# Patient Record
Sex: Female | Born: 1965 | Race: Black or African American | Hispanic: No | Smoking: Never smoker
Health system: Southern US, Community
[De-identification: ages and names within clinical notes are randomized; demographics above are authoritative.]

## PROBLEM LIST (undated history)

## (undated) DIAGNOSIS — B009 Herpesviral infection, unspecified: Secondary | ICD-10-CM

## (undated) DIAGNOSIS — K219 Gastro-esophageal reflux disease without esophagitis: Secondary | ICD-10-CM

## (undated) DIAGNOSIS — R011 Cardiac murmur, unspecified: Secondary | ICD-10-CM

## (undated) DIAGNOSIS — N200 Calculus of kidney: Secondary | ICD-10-CM

## (undated) HISTORY — PX: ABDOMINAL HYSTERECTOMY: SHX81

## (undated) HISTORY — DX: Gastro-esophageal reflux disease without esophagitis: K21.9

## (undated) HISTORY — DX: Calculus of kidney: N20.0

---

## 2006-10-19 ENCOUNTER — Ambulatory Visit: Payer: Self-pay | Admitting: Internal Medicine

## 2006-11-15 ENCOUNTER — Ambulatory Visit: Payer: Self-pay | Admitting: Emergency Medicine

## 2006-11-26 ENCOUNTER — Encounter: Payer: Self-pay | Admitting: Emergency Medicine

## 2006-12-08 ENCOUNTER — Encounter: Payer: Self-pay | Admitting: Emergency Medicine

## 2007-01-08 ENCOUNTER — Encounter: Payer: Self-pay | Admitting: Emergency Medicine

## 2007-01-29 ENCOUNTER — Ambulatory Visit: Payer: Self-pay | Admitting: Family Medicine

## 2007-08-02 ENCOUNTER — Ambulatory Visit: Payer: Self-pay | Admitting: Internal Medicine

## 2007-08-03 ENCOUNTER — Other Ambulatory Visit: Payer: Self-pay

## 2007-08-03 ENCOUNTER — Inpatient Hospital Stay: Payer: Self-pay | Admitting: Internal Medicine

## 2007-08-09 ENCOUNTER — Ambulatory Visit: Payer: Self-pay | Admitting: Internal Medicine

## 2007-12-12 ENCOUNTER — Ambulatory Visit: Payer: Self-pay | Admitting: Unknown Physician Specialty

## 2007-12-22 ENCOUNTER — Ambulatory Visit: Payer: Self-pay | Admitting: Unknown Physician Specialty

## 2008-02-17 ENCOUNTER — Ambulatory Visit: Payer: Self-pay | Admitting: Unknown Physician Specialty

## 2008-03-06 ENCOUNTER — Ambulatory Visit: Payer: Self-pay | Admitting: Unknown Physician Specialty

## 2014-03-27 ENCOUNTER — Ambulatory Visit: Payer: Self-pay | Admitting: Physician Assistant

## 2014-03-27 LAB — URINALYSIS, COMPLETE
Bilirubin,UR: NEGATIVE
Glucose,UR: NEGATIVE
Ketone: NEGATIVE
Leukocyte Esterase: NEGATIVE
Nitrite: NEGATIVE
Ph: 5 (ref 5.0–8.0)
Protein: 100
Specific Gravity: >=1.03 (ref 1.000–1.030)

## 2014-03-29 LAB — URINE CULTURE

## 2017-10-20 ENCOUNTER — Other Ambulatory Visit: Payer: Self-pay

## 2017-10-20 MED ORDER — ACYCLOVIR 400 MG PO TABS: ORAL_TABLET | ORAL | 0 refills | Status: DC

## 2017-11-13 ENCOUNTER — Encounter: Payer: Self-pay | Admitting: Gynecology

## 2017-11-13 ENCOUNTER — Ambulatory Visit
Admission: EM | Admit: 2017-11-13 | Discharge: 2017-11-13 | Disposition: A | Discharge status: Home / Self Care | Payer: Managed Care, Other (non HMO) | Attending: Family Medicine | Admitting: Family Medicine

## 2017-11-13 DIAGNOSIS — R079 Chest pain, unspecified: Secondary | ICD-10-CM | POA: Diagnosis present

## 2017-11-13 DIAGNOSIS — R001 Bradycardia, unspecified: Secondary | ICD-10-CM | POA: Insufficient documentation

## 2017-11-13 MED ORDER — PANTOPRAZOLE SODIUM 40 MG PO TBEC: 40.0000 mg | DELAYED_RELEASE_TABLET | Freq: Every day | ORAL | 0 refills | Status: DC

## 2017-11-13 NOTE — ED Triage Notes (Signed)
Patient c/o chest pain x 1 hour ago.

## 2017-11-13 NOTE — Discharge Instructions (Signed)
Medication as prescribed.  Take care  Dr. Johnatan Baskette  

## 2017-11-13 NOTE — ED Provider Notes (Signed)
[facility]    CSN: [id number] Arrival date & time: 11/13/17  1510   History   Chief Complaint Chief Complaint  Patient presents with  . Chest Pain   HPI 52 year old female presents with chest pain.  Patient has had ongoing chest pain over the past month.  She has had a few episodes.  Had chest pain earlier today around 2 PM.  Lasted approximately 10 minutes and then resolved.  Described as burning.  Located centrally.  No association with exertion.  No diaphoresis.  Patient does state that she has had issues with heartburn in the past.  However, she states that it does not seem to be related to meals.  She has been skipping meals regularly.  Denies family history of cardiac disease.  Non-smoker.  No known exacerbating or relieving factors.  No other associated symptoms.  No other complaints.  PMH, Surgical Hx, Family Hx, Social History reviewed and updated as below.  PMH - Denies medical problems.  Past Surgical History:  Procedure Laterality Date  . ABDOMINAL HYSTERECTOMY      OB History   None      Home Medications    Prior to Admission medications   Medication Sig Start Date End Date Taking? Authorizing Provider  pantoprazole (PROTONIX) 40 MG tablet Take 1 tablet (40 mg total) by mouth daily. 11/13/17   Tommie Sams, DO    Family History Family History  Problem Relation Age of Onset  . Healthy Mother   . Healthy Father     Social History Social History   Tobacco Use  . Smoking status: Never Smoker  . Smokeless tobacco: Never Used  Substance Use Topics  . Alcohol use: Yes  . Drug use: Not on file     Allergies   Patient has no known allergies.   Review of Systems Review of Systems  Respiratory: Negative.   Cardiovascular: Positive for chest pain.   Physical Exam Triage Vital Signs ED Triage Vitals  Enc Vitals Group     BP 11/13/17 1528 106/78     Pulse Rate 11/13/17 1528 60     Resp 11/13/17 1528 16     Temp 11/13/17 1528 98.3 F  (36.8 C)     Temp Source 11/13/17 1528 Oral     SpO2 11/13/17 1528 100 %     Weight 11/13/17 1527 175 lb (79.4 kg)     Height 11/13/17 1527 5\' 6"  (1.676 m)     Head Circumference --      Peak Flow --      Pain Score 11/13/17 1620 1     Pain Loc --      Pain Edu? --      Excl. in GC? --    Updated Vital Signs BP 106/78 (BP Location: Left Arm)   Pulse 60   Temp 98.3 F (36.8 C) (Oral)   Resp 16   Ht 5\' 6"  (1.676 m)   Wt 79.4 kg   SpO2 100%   BMI 28.25 kg/m   Visual Acuity Right Eye Distance:   Left Eye Distance:   Bilateral Distance:    Right Eye Near:   Left Eye Near:    Bilateral Near:     Physical Exam  Constitutional: She is oriented to person, place, and time. She appears well-developed. No distress.  Cardiovascular: Normal rate and regular rhythm.  Pulmonary/Chest: Effort normal and breath sounds normal. She has no wheezes. She has no rales.  Abdominal: Soft. She  exhibits no distension. There is no tenderness.  Neurological: She is alert and oriented to person, place, and time.  Psychiatric: She has a normal mood and affect. Her behavior is normal.  Nursing note and vitals reviewed.  UC Treatments / Results  Labs (all labs ordered are listed, but only abnormal results are displayed) Labs Reviewed - No data to display  EKG Interpretation: Sinus bradycardia at the rate of 55.  Normal axis.  Normal intervals.  No ST-T wave changes.  Normal EKG.  Radiology No results found.  Procedures Procedures (including critical care time)  Medications Ordered in UC Medications - No data to display  Initial Impression / Assessment and Plan / UC Course  I have reviewed the triage vital signs and the nursing notes.  Pertinent labs & imaging results that were available during my care of the patient were reviewed by me and considered in my medical decision making (see chart for details).    52 year old female presents with chest pain.  Suspect GERD.  She is very  well-appearing with no risk factors or family history of cardiac disease.  EKG normal.  Recently had labs which were normal.  Discharging on Protonix.  Final Clinical Impressions(s) / UC Diagnoses   Final diagnoses:  Chest pain, unspecified type     Discharge Instructions     Medication as prescribed.  Take care  Dr. Adriana Simas    ED Prescriptions    Medication Sig Dispense Auth. Provider   pantoprazole (PROTONIX) 40 MG tablet Take 1 tablet (40 mg total) by mouth daily. 30 tablet Tommie Sams, DO     Controlled Substance Prescriptions [place] Controlled Substance Registry consulted? Not Applicable   Tommie Sams, DO 11/13/17 8828

## 2017-11-22 ENCOUNTER — Encounter: Payer: Self-pay | Admitting: Nurse Practitioner

## 2017-11-29 ENCOUNTER — Ambulatory Visit (INDEPENDENT_AMBULATORY_CARE_PROVIDER_SITE_OTHER): Payer: Managed Care, Other (non HMO) | Admitting: Nurse Practitioner

## 2017-11-29 ENCOUNTER — Encounter: Payer: Self-pay | Admitting: Nurse Practitioner

## 2017-11-29 VITALS — BP 125/76 | HR 59 | Resp 16 | Ht 67.0 in | Wt 186.4 lb

## 2017-11-29 DIAGNOSIS — Z1239 Encounter for other screening for malignant neoplasm of breast: Secondary | ICD-10-CM

## 2017-11-29 DIAGNOSIS — K219 Gastro-esophageal reflux disease without esophagitis: Secondary | ICD-10-CM | POA: Diagnosis not present

## 2017-11-29 DIAGNOSIS — Z23 Encounter for immunization: Secondary | ICD-10-CM

## 2017-11-29 DIAGNOSIS — Z6829 Body mass index (BMI) 29.0-29.9, adult: Secondary | ICD-10-CM | POA: Diagnosis not present

## 2017-11-29 DIAGNOSIS — R300 Dysuria: Secondary | ICD-10-CM

## 2017-11-29 DIAGNOSIS — Z0001 Encounter for general adult medical examination with abnormal findings: Secondary | ICD-10-CM | POA: Diagnosis not present

## 2017-11-29 DIAGNOSIS — Z1231 Encounter for screening mammogram for malignant neoplasm of breast: Secondary | ICD-10-CM

## 2017-11-29 NOTE — Progress Notes (Signed)
[facility] [address] [place], [place] [address]  Internal MEDICINE  Office Visit Note  Patient Name: Nina Lowe  [id number]  [id number]  Date of Service: 12/06/2017   Pt is here for routine health maintenance examination  Chief Complaint  Patient presents with  . Annual Exam    pt stated she went to urgent care for chest pains 3wks ago they done EKG didnt find anything she thinks its heartburns, has only happened once since that isit and it happens during the night  . Quality Metric Gaps    mammo,colonoscopy     Patient states that she recently visited urgent care with chest pain. ECG done there was normal. She was diagnosed with acid reflux. She was given a prescription for pantoprazole, but she has not taken it. She is afraid of the side effects associated with taking this medication.  She has also brought with her a waver form for her health insurance, stating that she will participate in medically managed program for weight loss and will be followed closely. This form will help to prevent her insurance premiums from increasing due to BMI over 29.9.     Current Medication: Outpatient Encounter Medications as of 11/29/2017  Medication Sig  . pantoprazole (PROTONIX) 40 MG tablet Take 1 tablet (40 mg total) by mouth daily. (Patient not taking: Reported on 11/29/2017)   No facility-administered encounter medications on file as of 11/29/2017.     Surgical History: Past Surgical History:  Procedure Laterality Date  . ABDOMINAL HYSTERECTOMY      Medical History: History reviewed. No pertinent past medical history.  Family History: Family History  Problem Relation Age of Onset  . Healthy Mother   . Healthy Father       Review of Systems  Constitutional: Negative for activity change, chills, fatigue and unexpected weight change.  HENT: Negative for congestion, postnasal drip, rhinorrhea, sneezing and sore throat.   Eyes: Negative.  Negative  for redness.  Respiratory: Negative for cough, chest tightness and shortness of breath.   Cardiovascular: Negative for chest pain and palpitations.  Gastrointestinal: Negative for abdominal pain, constipation, diarrhea, nausea and vomiting.       Intermittent episodes of GERD.   Genitourinary: Negative for dysuria and frequency.  Musculoskeletal: Negative for arthralgias, back pain, joint swelling and neck pain.  Skin: Negative for rash.  Neurological: Negative.  Negative for tremors and numbness.  Hematological: Negative for adenopathy. Does not bruise/bleed easily.  Psychiatric/Behavioral: Negative for behavioral problems (Depression), sleep disturbance and suicidal ideas. The patient is not nervous/anxious.       Today's Vitals   11/29/17 1446  BP: 125/76  Pulse: (!) 59  Resp: 16  SpO2: 99%  Weight: 186 lb 6.4 oz (84.6 kg)  Height: 5\' 7"  (1.702 m)    Physical Exam  Constitutional: She is oriented to person, place, and time. She appears well-developed and well-nourished. No distress.  HENT:  Head: Normocephalic and atraumatic.  Nose: Nose normal.  Mouth/Throat: Oropharynx is clear and moist. No oropharyngeal exudate.  Eyes: Pupils are equal, round, and reactive to light. Conjunctivae and EOM are normal.  Neck: Normal range of motion. Neck supple. No JVD present. No tracheal deviation present. No thyromegaly present.  Cardiovascular: Normal rate, regular rhythm, normal heart sounds and intact distal pulses. Exam reveals no gallop and no friction rub.  No murmur heard. Pulmonary/Chest: Effort normal and breath sounds normal. No respiratory distress. She has no wheezes. She has no rales. She exhibits  no tenderness. Right breast exhibits no inverted nipple, no mass, no nipple discharge, no skin change and no tenderness. Left breast exhibits no inverted nipple, no mass, no nipple discharge, no skin change and no tenderness.  Abdominal: Soft. Bowel sounds are normal. There is no  tenderness.  Musculoskeletal: Normal range of motion.  Lymphadenopathy:    She has no cervical adenopathy.  Neurological: She is alert and oriented to person, place, and time. No cranial nerve deficit.  Skin: Skin is warm and dry. Capillary refill takes less than 2 seconds. She is not diaphoretic.  Psychiatric: She has a normal mood and affect. Her behavior is normal. Judgment and thought content normal.  Nursing note and vitals reviewed.    LABS: Recent Results (from the past 2160 hour(s))  UA/M w/rflx Culture, Routine     Status: Abnormal   Collection Time: 11/29/17  2:39 PM  Result Value Ref Range   Specific Gravity, UA 1.025 1.005 - 1.030   pH, UA 5.0 5.0 - 7.5   Color, UA Yellow Yellow   Appearance Ur Clear Clear   Leukocytes, UA Negative Negative   Protein, UA Negative Negative/Trace   Glucose, UA Negative Negative   Ketones, UA Trace (A) Negative   RBC, UA Negative Negative   Bilirubin, UA Negative Negative   Urobilinogen, Ur 1.0 0.2 - 1.0 mg/dL   Nitrite, UA Negative Negative   Microscopic Examination Comment     Comment: Microscopic follows if indicated.   Microscopic Examination See below:     Comment: Microscopic was indicated and was performed.   Urinalysis Reflex Comment     Comment: This specimen will not reflex to a Urine Culture.  Microscopic Examination     Status: Abnormal   Collection Time: 11/29/17  2:39 PM  Result Value Ref Range   WBC, UA 0-5 0 - 5 /hpf   RBC, UA None seen 0 - 2 /hpf   Epithelial Cells (non renal) >10 (A) 0 - 10 /hpf   Casts None seen None seen /lpf   Crystals Present (A) N/A   Crystal Type Calcium Oxalate N/A   Mucus, UA Present Not Estab.   Bacteria, UA Few None seen/Few    Assessment/Plan: 1. Encounter for general adult medical examination with abnormal findings Annual wellness visit today.   2. Gastroesophageal reflux disease without esophagitis Recommend she take protonix as prescribed and as needed. Advised her to avoid  triggers and to sleep with HOB raised up 30 degrees.   3. BMI 29.0-29.9,adult A waiver form was completed and returned to the patient at the time of visit, stating that she would be participating in medically managed program for weight management. Goals are to keep calorie intke to 1200-1500 calories per day and to participate in routine exercise program.   4. Needs flu shot - Flu Vaccine MDCK QUAD PF  5. Screening for breast cancer - MM 3D SCREEN BREAST BILATERAL; Future  6. Dysuria - UA/M w/rflx Culture, Routine  General Counseling: Muskaan verbalizes understanding of the findings of todays visit and agrees with plan of treatment. I have discussed any further diagnostic evaluation that may be needed or ordered today. We also reviewed her medications today. she has been encouraged to call the office with any questions or concerns that should arise related to todays visit.    Counseling:   There is a liability release in patients' chart. There has been a 10 minute discussion about the side effects including but not limited to elevated blood  pressure, anxiety, lack of sleep and dry mouth. Pt understands and will like to start/continue on appetite suppressant at this time. There will be one month RX given at the time of visit with proper follow up. Nova diet plan with restricted calories is given to the pt. Pt understands and agrees with  plan of treatment  This patient was seen by Vincent Gros FNP Collaboration with Dr Lyndon Code as a part of collaborative care agreement  Orders Placed This Encounter  Procedures  . Microscopic Examination  . MM 3D SCREEN BREAST BILATERAL  . Flu Vaccine MDCK QUAD PF  . UA/M w/rflx Culture, Routine      Time spent:      Lyndon Code, MD  Internal Medicine

## 2017-11-30 LAB — UA/M W/RFLX CULTURE, ROUTINE
Bilirubin, UA: NEGATIVE
Glucose, UA: NEGATIVE
LEUKOCYTES UA: NEGATIVE
NITRITE UA: NEGATIVE
PH UA: 5 (ref 5.0–7.5)
PROTEIN UA: NEGATIVE
RBC UA: NEGATIVE
Specific Gravity, UA: 1.025 (ref 1.005–1.030)
UUROB: 1 mg/dL (ref 0.2–1.0)

## 2017-11-30 LAB — MICROSCOPIC EXAMINATION
CASTS: NONE SEEN /LPF
RBC, UA: NONE SEEN /hpf (ref 0–2)

## 2017-12-06 DIAGNOSIS — Z1239 Encounter for other screening for malignant neoplasm of breast: Secondary | ICD-10-CM | POA: Insufficient documentation

## 2017-12-06 DIAGNOSIS — Z23 Encounter for immunization: Secondary | ICD-10-CM | POA: Insufficient documentation

## 2017-12-06 DIAGNOSIS — R300 Dysuria: Secondary | ICD-10-CM | POA: Insufficient documentation

## 2017-12-06 DIAGNOSIS — Z6829 Body mass index (BMI) 29.0-29.9, adult: Secondary | ICD-10-CM | POA: Insufficient documentation

## 2017-12-06 DIAGNOSIS — K219 Gastro-esophageal reflux disease without esophagitis: Secondary | ICD-10-CM | POA: Insufficient documentation

## 2017-12-16 ENCOUNTER — Ambulatory Visit
Admission: RE | Admit: 2017-12-16 | Discharge: 2017-12-16 | Disposition: A | Discharge status: Home / Self Care | Payer: Managed Care, Other (non HMO) | Source: Ambulatory Visit | Attending: Nurse Practitioner | Admitting: Nurse Practitioner

## 2017-12-16 DIAGNOSIS — Z1239 Encounter for other screening for malignant neoplasm of breast: Secondary | ICD-10-CM | POA: Insufficient documentation

## 2017-12-24 ENCOUNTER — Encounter: Payer: Self-pay | Admitting: Nurse Practitioner

## 2018-01-11 ENCOUNTER — Telehealth: Payer: Self-pay

## 2018-01-11 NOTE — Telephone Encounter (Signed)
COLOGUARD RESULTS CAME BACK NORMAL. CALLED PATIENT AND INFORMED HER OF THIS.

## 2018-01-24 ENCOUNTER — Other Ambulatory Visit: Payer: Self-pay

## 2018-01-25 ENCOUNTER — Other Ambulatory Visit: Payer: Self-pay

## 2018-01-25 MED ORDER — ACYCLOVIR 400 MG PO TABS: ORAL_TABLET | ORAL | 0 refills | Status: DC

## 2018-02-01 ENCOUNTER — Encounter: Payer: Self-pay | Admitting: Adult Health

## 2018-02-01 LAB — COLOGUARD

## 2018-02-09 ENCOUNTER — Encounter: Payer: Self-pay | Admitting: Emergency Medicine

## 2018-02-09 ENCOUNTER — Ambulatory Visit
Admission: EM | Admit: 2018-02-09 | Discharge: 2018-02-09 | Disposition: A | Discharge status: Home / Self Care | Payer: Managed Care, Other (non HMO) | Attending: Family Medicine | Admitting: Family Medicine

## 2018-02-09 ENCOUNTER — Other Ambulatory Visit: Payer: Self-pay

## 2018-02-09 DIAGNOSIS — B9789 Other viral agents as the cause of diseases classified elsewhere: Secondary | ICD-10-CM | POA: Diagnosis not present

## 2018-02-09 DIAGNOSIS — J069 Acute upper respiratory infection, unspecified: Secondary | ICD-10-CM | POA: Diagnosis not present

## 2018-02-09 DIAGNOSIS — J040 Acute laryngitis: Secondary | ICD-10-CM | POA: Diagnosis not present

## 2018-02-09 HISTORY — DX: Herpesviral infection, unspecified: B00.9

## 2018-02-09 MED ORDER — PREDNISONE 20 MG PO TABS: 20.0000 mg | ORAL_TABLET | Freq: Every day | ORAL | 0 refills | Status: DC

## 2018-02-09 MED ORDER — BENZONATATE 200 MG PO CAPS: 200.0000 mg | ORAL_CAPSULE | Freq: Three times a day (TID) | ORAL | 0 refills | Status: DC | PRN

## 2018-02-09 MED ORDER — HYDROCOD POLST-CPM POLST ER 10-8 MG/5ML PO SUER: 5.0000 mL | Freq: Every evening | ORAL | 0 refills | Status: DC | PRN

## 2018-02-09 NOTE — ED Provider Notes (Signed)
[facility]    CSN: [id number] Arrival date & time: 02/09/18  0857     History   Chief Complaint Chief Complaint  Patient presents with  . Cough    APPT  . Laryngitis    HPI Nina Lowe is a 52 y.o. female.   The history is provided by the patient.  URI  Presenting symptoms: congestion, cough and rhinorrhea   Severity:  Moderate Onset quality:  Sudden Duration:  5 days Timing:  Constant Progression:  Worsening Chronicity:  New Relieved by:  Nothing Associated symptoms comment:  Loss of voice Risk factors: sick contacts   Risk factors: not elderly, no chronic cardiac disease, no chronic kidney disease, no chronic respiratory disease, no diabetes mellitus, no immunosuppression, no recent illness and no recent travel     Past Medical History:  Diagnosis Date  . Herpes     Patient Active Problem List   Diagnosis Date Noted  . Screening for breast cancer 12/06/2017  . Gastroesophageal reflux disease without esophagitis 12/06/2017  . BMI 29.0-29.9,adult 12/06/2017  . Needs flu shot 12/06/2017  . Dysuria 12/06/2017    Past Surgical History:  Procedure Laterality Date  . ABDOMINAL HYSTERECTOMY      OB History   None      Home Medications    Prior to Admission medications   Medication Sig Start Date End Date Taking? Authorizing Provider  acyclovir (ZOVIRAX) 400 MG tablet Take 1 tab by po 3 times a day for 5 days for flare ups 01/25/18  Yes Boscia, Heather E, NP  benzonatate (TESSALON) 200 MG capsule Take 1 capsule (200 mg total) by mouth 3 (three) times daily as needed. 02/09/18   Payton Mccallumonty, Caylyn Tedeschi, MD  chlorpheniramine-HYDROcodone (TUSSIONEX PENNKINETIC ER) 10-8 MG/5ML SUER Take 5 mLs by mouth at bedtime as needed. 02/09/18   Payton Mccallumonty, Shyhiem Beeney, MD  pantoprazole (PROTONIX) 40 MG tablet Take 1 tablet (40 mg total) by mouth daily. Patient not taking: Reported on 11/29/2017 11/13/17   Tommie Samsook, Jayce G, DO  predniSONE (DELTASONE) 20 MG tablet Take 1  tablet (20 mg total) by mouth daily. 02/09/18   Payton Mccallumonty, Danile Trier, MD    Family History Family History  Problem Relation Age of Onset  . Hyperlipidemia Mother   . Healthy Father   . Breast cancer Cousin [id number]    Social History Social History   Tobacco Use  . Smoking status: Never Smoker  . Smokeless tobacco: Never Used  Substance Use Topics  . Alcohol use: Yes    Comment: occassional  . Drug use: Never     Allergies   Patient has no known allergies.   Review of Systems Review of Systems  HENT: Positive for congestion and rhinorrhea.   Respiratory: Positive for cough.      Physical Exam Triage Vital Signs ED Triage Vitals [02/09/18 0918]  Enc Vitals Group     BP 129/80     Pulse Rate 77     Resp 16     Temp 98.1 F (36.7 C)     Temp Source Oral     SpO2 100 %     Weight 170 lb (77.1 kg)     Height 5\' 7"  (1.702 m)     Head Circumference      Peak Flow      Pain Score 0     Pain Loc      Pain Edu?      Excl. in GC?    No data found.  Updated Vital Signs BP 129/80 (BP Location: Left Arm)   Pulse 77   Temp 98.1 F (36.7 C) (Oral)   Resp 16   Ht 5\' 7"  (1.702 m)   Wt 77.1 kg   SpO2 100%   BMI 26.63 kg/m   Visual Acuity Right Eye Distance:   Left Eye Distance:   Bilateral Distance:    Right Eye Near:   Left Eye Near:    Bilateral Near:     Physical Exam  Constitutional: She appears well-developed and well-nourished. No distress.  HENT:  Head: Normocephalic and atraumatic.  Right Ear: Tympanic membrane, external ear and ear canal normal.  Left Ear: Tympanic membrane, external ear and ear canal normal.  Nose: Mucosal edema and rhinorrhea present. No nose lacerations, sinus tenderness, nasal deformity, septal deviation or nasal septal hematoma. No epistaxis.  No foreign bodies.  Mouth/Throat: Uvula is midline and mucous membranes are normal. Posterior oropharyngeal erythema present. No oropharyngeal exudate, posterior oropharyngeal edema or tonsillar  abscesses. No tonsillar exudate.  Eyes: Right eye exhibits no discharge. Left eye exhibits no discharge. No scleral icterus.  Neck: Normal range of motion. Neck supple. No thyromegaly present.  Cardiovascular: Normal rate, regular rhythm and normal heart sounds.  Pulmonary/Chest: Effort normal and breath sounds normal. No stridor. No respiratory distress. She has no wheezes. She has no rales.  Lymphadenopathy:    She has no cervical adenopathy.  Skin: She is not diaphoretic.  Nursing note and vitals reviewed.    UC Treatments / Results  Labs (all labs ordered are listed, but only abnormal results are displayed) Labs Reviewed - No data to display  EKG None  Radiology No results found.  Procedures Procedures (including critical care time)  Medications Ordered in UC Medications - No data to display  Initial Impression / Assessment and Plan / UC Course  I have reviewed the triage vital signs and the nursing notes.  Pertinent labs & imaging results that were available during my care of the patient were reviewed by me and considered in my medical decision making (see chart for details).      Final Clinical Impressions(s) / UC Diagnoses   Final diagnoses:  Viral URI with cough  Laryngitis    ED Prescriptions    Medication Sig Dispense Auth. Provider   predniSONE (DELTASONE) 20 MG tablet Take 1 tablet (20 mg total) by mouth daily. 7 tablet Brittny Spangle, Pamala Hurry, MD   benzonatate (TESSALON) 200 MG capsule Take 1 capsule (200 mg total) by mouth 3 (three) times daily as needed. 30 capsule Payton Mccallum, MD   chlorpheniramine-HYDROcodone (TUSSIONEX PENNKINETIC ER) 10-8 MG/5ML SUER Take 5 mLs by mouth at bedtime as needed. 60 mL Payton Mccallum, MD      1. diagnosis reviewed with patient 2. rx as per orders above; reviewed possible side effects, interactions, risks and benefits  3. Recommend supportive treatment with otc meds prn 4. Follow-up prn if symptoms worsen or don't  improve   Controlled Substance Prescriptions [place] Controlled Substance Registry consulted? Not Applicable   Payton Mccallum, MD 02/09/18 1143

## 2018-02-09 NOTE — ED Triage Notes (Signed)
Patient in today c/o cough and laryngitis x 5 days. Patient denies fever. Patient has tried OTC Delsym, Tussin, Thermaflu without relief.

## 2018-12-01 ENCOUNTER — Encounter: Payer: Self-pay | Admitting: Adult Health

## 2018-12-07 ENCOUNTER — Ambulatory Visit (INDEPENDENT_AMBULATORY_CARE_PROVIDER_SITE_OTHER): Payer: Managed Care, Other (non HMO) | Admitting: Nurse Practitioner

## 2018-12-07 ENCOUNTER — Other Ambulatory Visit: Payer: Self-pay

## 2018-12-07 ENCOUNTER — Encounter: Payer: Self-pay | Admitting: Nurse Practitioner

## 2018-12-07 VITALS — BP 120/82 | HR 50 | Temp 97.4°F | Resp 16 | Ht 67.0 in | Wt 199.0 lb

## 2018-12-07 DIAGNOSIS — Z0001 Encounter for general adult medical examination with abnormal findings: Secondary | ICD-10-CM

## 2018-12-07 DIAGNOSIS — R300 Dysuria: Secondary | ICD-10-CM

## 2018-12-07 DIAGNOSIS — N390 Urinary tract infection, site not specified: Secondary | ICD-10-CM

## 2018-12-07 DIAGNOSIS — R5383 Other fatigue: Secondary | ICD-10-CM | POA: Diagnosis not present

## 2018-12-07 DIAGNOSIS — Z1239 Encounter for other screening for malignant neoplasm of breast: Secondary | ICD-10-CM | POA: Diagnosis not present

## 2018-12-07 DIAGNOSIS — Z124 Encounter for screening for malignant neoplasm of cervix: Secondary | ICD-10-CM | POA: Insufficient documentation

## 2018-12-07 LAB — POCT URINALYSIS DIPSTICK
Bilirubin, UA: POSITIVE
Blood, UA: NEGATIVE
Glucose, UA: NEGATIVE
Ketones, UA: NEGATIVE
Nitrite, UA: NEGATIVE
Protein, UA: POSITIVE — AB
Spec Grav, UA: 1.025 (ref 1.010–1.025)
Urobilinogen, UA: 0.2 E.U./dL
pH, UA: 5 (ref 5.0–8.0)

## 2018-12-07 MED ORDER — SULFAMETHOXAZOLE-TRIMETHOPRIM 800-160 MG PO TABS: 1.0000 | ORAL_TABLET | Freq: Two times a day (BID) | ORAL | 0 refills | Status: DC

## 2018-12-07 NOTE — Progress Notes (Signed)
[facility] [address] [place], [place] [address]  Internal MEDICINE  Office Visit Note  Patient Name: Nina BoronRobbin Rochelle Lowe  [id number]  [id number]  Date of Service: 12/07/2018  Chief Complaint  Patient presents with  . Annual Exam  . Back Pain    lower back pain that has went down to her tail bone, has been using preparation h for hemmrhoids  . Arthritis    feels like theres arthritis all over body sometimes    [name] presents to clinic today for an annual physical exam. She also has complaints of lower back pain and coccydynia for approximately 6 days. She reports that the pain began last Thursday upon standing from her office chair. She saw a chiropractor the following day, which she reports seems to have aggravated her symptoms. She reports that the pain is aggravated by prolonged sitting and radiates up her back upon standing. She describes it as aching, shooting, and stiffness. She reports that her pain has gone from being severe to feeling "better this morning." She states that she suspects that her coccydynia may be related to hemorrhoids, and that soaking in a hot bath and application of preparation H has helped. She also reports that she experiences intermittent constipation and must occasionally strain with bowel movements. She also reports some numbness and tingling with prolonged sitting or lying in one position, and some generalized arthralgia.      Current Medication: Outpatient Encounter Medications as of 12/07/2018  Medication Sig  . acyclovir (ZOVIRAX) 400 MG tablet Take 1 tab by po 3 times a day for 5 days for flare ups  . sulfamethoxazole-trimethoprim (BACTRIM DS) 800-160 MG tablet Take 1 tablet by mouth 2 (two) times daily.  . [DISCONTINUED] benzonatate (TESSALON) 200 MG capsule Take 1 capsule (200 mg total) by mouth 3 (three) times daily as needed. (Patient not taking: Reported on 12/07/2018)  . [DISCONTINUED] chlorpheniramine-HYDROcodone  (TUSSIONEX PENNKINETIC ER) 10-8 MG/5ML SUER Take 5 mLs by mouth at bedtime as needed. (Patient not taking: Reported on 12/07/2018)  . [DISCONTINUED] pantoprazole (PROTONIX) 40 MG tablet Take 1 tablet (40 mg total) by mouth daily. (Patient not taking: Reported on 11/29/2017)  . [DISCONTINUED] predniSONE (DELTASONE) 20 MG tablet Take 1 tablet (20 mg total) by mouth daily. (Patient not taking: Reported on 12/07/2018)   No facility-administered encounter medications on file as of 12/07/2018.     Surgical History: Past Surgical History:  Procedure Laterality Date  . ABDOMINAL HYSTERECTOMY      Medical History: Past Medical History:  Diagnosis Date  . Herpes     Family History: Family History  Problem Relation Age of Onset  . Hyperlipidemia Mother   . Healthy Father   . Breast cancer Cousin 1251    Social History   Socioeconomic History  . Marital status: Married    Spouse name: Not on file  . Number of children: Not on file  . Years of education: Not on file  . Highest education level: Not on file  Occupational History  . Not on file  Social Needs  . Financial resource strain: Not on file  . Food insecurity    Worry: Not on file    Inability: Not on file  . Transportation needs    Medical: Not on file    Non-medical: Not on file  Tobacco Use  . Smoking status: Never Smoker  . Smokeless tobacco: Never Used  Substance and Sexual Activity  . Alcohol use: Yes    Comment:  occassional  . Drug use: Never  . Sexual activity: Not on file  Lifestyle  . Physical activity    Days per week: Not on file    Minutes per session: Not on file  . Stress: Not on file  Relationships  . Social Musician on phone: Not on file    Gets together: Not on file    Attends religious service: Not on file    Active member of club or organization: Not on file    Attends meetings of clubs or organizations: Not on file    Relationship status: Not on file  . Intimate partner violence     Fear of current or ex partner: Not on file    Emotionally abused: Not on file    Physically abused: Not on file    Forced sexual activity: Not on file  Other Topics Concern  . Not on file  Social History Narrative  . Not on file      Review of Systems  Constitutional: Negative for chills, fatigue, fever and unexpected weight change.  HENT: Negative for congestion, hearing loss, rhinorrhea, sinus pressure and sinus pain.   Respiratory: Negative for chest tightness, shortness of breath and wheezing.   Cardiovascular: Positive for palpitations. Negative for chest pain.       Reports occasional palpitations when anxious, subsides with calm  Gastrointestinal: Positive for rectal pain. Negative for diarrhea, nausea and vomiting.       Possible hemorrhoids   Endocrine: Negative for cold intolerance, heat intolerance, polydipsia and polyuria.       Reports "hot flashes" that she attributes perimenopause  Genitourinary: Positive for flank pain. Negative for difficulty urinating and dysuria.  Musculoskeletal: Positive for back pain.  Allergic/Immunologic: Negative for food allergies.  Neurological: Negative for dizziness, light-headedness and headaches.       Numbness and tingling with prolonged sitting or lying   Today's Vitals   12/07/18 0858  BP: 120/82  Pulse: (!) 50  Resp: 16  Temp: (!) 97.4 F (36.3 C)  SpO2: 99%  Weight: 199 lb (90.3 kg)   Body mass index is 31.17 kg/m.   Physical Exam Vitals signs and nursing note reviewed.  Constitutional:      General: She is not in acute distress.    Appearance: Normal appearance.  HENT:     Nose: Nose normal.  Eyes:     Extraocular Movements: Extraocular movements intact.     Pupils: Pupils are equal, round, and reactive to light.  Neck:     Musculoskeletal: Normal range of motion and neck supple. No muscular tenderness.  Cardiovascular:     Rate and Rhythm: Normal rate and regular rhythm.     Pulses: Normal pulses.      Heart sounds: Normal heart sounds.  Pulmonary:     Effort: Pulmonary effort is normal.     Breath sounds: Normal breath sounds.  Chest:     Breasts:        Right: Normal. No swelling, bleeding, inverted nipple, mass, nipple discharge, skin change or tenderness.        Left: Normal. No swelling, bleeding, inverted nipple, mass, nipple discharge, skin change or tenderness.  Abdominal:     General: Bowel sounds are normal. There is no distension.     Palpations: Abdomen is soft. There is no mass.     Tenderness: There is no abdominal tenderness. There is no right CVA tenderness or left CVA tenderness.  Genitourinary:  Comments: Small, internal hemorrhoid present  Musculoskeletal: Normal range of motion.        General: No swelling or tenderness.  Skin:    General: Skin is warm and dry.     Capillary Refill: Capillary refill takes 2 to 3 seconds.  Neurological:     Mental Status: She is alert and oriented to person, place, and time.  Psychiatric:        Mood and Affect: Mood normal.    Assessment/Plan:  1. Encounter for general adult medical examination with abnormal findings Annual health maintenance exam today. Lab slip given to get routine, fasting labs drawn.   2. Urinary tract infection without hematuria, site unspecified Start bactrim DS twice daily for 5 days. Send for culture and sensitivity and adjust antibiotics as indicated  - sulfamethoxazole-trimethoprim (BACTRIM DS) 800-160 MG tablet; Take 1 tablet by mouth 2 (two) times daily.  Dispense: 10 tablet; Refill: 0  3. Other fatigue Check labs, including thyroid panel for further evaluation.   4. Screening for breast cancer - MM DIGITAL SCREENING BILATERAL; Future  5. Dysuria - POCT Urinalysis Dipstick - CULTURE, URINE COMPREHENSIVE  General Counseling: Gladyse verbalizes understanding of the findings of todays visit and agrees with plan of treatment. I have discussed any further diagnostic evaluation that may be  needed or ordered today. We also reviewed her medications today. she has been encouraged to call the office with any questions or concerns that should arise related to todays visit.  This patient was seen by Leretha Pol FNP Collaboration with Dr Lavera Guise as a part of collaborative care agreement  Orders Placed This Encounter  Procedures  . CULTURE, URINE COMPREHENSIVE  . MM DIGITAL SCREENING BILATERAL  . POCT Urinalysis Dipstick    Meds ordered this encounter  Medications  . sulfamethoxazole-trimethoprim (BACTRIM DS) 800-160 MG tablet    Sig: Take 1 tablet by mouth 2 (two) times daily.    Dispense:  10 tablet    Refill:  0    Order Specific Question:   Supervising Provider    Answer:   Lavera Guise [[id number]]    Time spent: 78 Minutes      Dr Lavera Guise Internal medicine

## 2018-12-09 NOTE — Progress Notes (Signed)
Patient started on bactrim ds at her visit. Waiting for culture results.

## 2018-12-10 LAB — CULTURE, URINE COMPREHENSIVE

## 2018-12-30 ENCOUNTER — Other Ambulatory Visit: Payer: Self-pay | Admitting: Nurse Practitioner

## 2018-12-31 LAB — COMPREHENSIVE METABOLIC PANEL
ALT: 11 IU/L (ref 0–32)
AST: 15 IU/L (ref 0–40)
Albumin/Globulin Ratio: 1.3 (ref 1.2–2.2)
Albumin: 4.4 g/dL (ref 3.8–4.9)
Alkaline Phosphatase: 136 IU/L — ABNORMAL HIGH (ref 39–117)
BUN/Creatinine Ratio: 19 (ref 9–23)
BUN: 14 mg/dL (ref 6–24)
Bilirubin Total: 0.3 mg/dL (ref 0.0–1.2)
CO2: 22 mmol/L (ref 20–29)
Calcium: 9.5 mg/dL (ref 8.7–10.2)
Chloride: 103 mmol/L (ref 96–106)
Creatinine, Ser: 0.75 mg/dL (ref 0.57–1.00)
GFR calc Af Amer: 107 mL/min/{1.73_m2} (ref >59)
GFR calc non Af Amer: 93 mL/min/{1.73_m2} (ref >59)
Globulin, Total: 3.3 g/dL (ref 1.5–4.5)
Glucose: 81 mg/dL (ref 65–99)
Potassium: 4.6 mmol/L (ref 3.5–5.2)
Sodium: 140 mmol/L (ref 134–144)
Total Protein: 7.7 g/dL (ref 6.0–8.5)

## 2018-12-31 LAB — LIPID PANEL W/O CHOL/HDL RATIO
Cholesterol, Total: 160 mg/dL (ref 100–199)
HDL: 55 mg/dL (ref >39)
LDL Chol Calc (NIH): 97 mg/dL (ref 0–99)
Triglycerides: 37 mg/dL (ref 0–149)
VLDL Cholesterol Cal: 8 mg/dL (ref 5–40)

## 2018-12-31 LAB — CBC
Hematocrit: 38.2 % (ref 34.0–46.6)
Hemoglobin: 12.3 g/dL (ref 11.1–15.9)
MCH: 28.4 pg (ref 26.6–33.0)
MCHC: 32.2 g/dL (ref 31.5–35.7)
MCV: 88 fL (ref 79–97)
Platelets: 276 10*3/uL (ref 150–450)
RBC: 4.33 x10E6/uL (ref 3.77–5.28)
RDW: 12.2 % (ref 11.7–15.4)
WBC: 4.5 10*3/uL (ref 3.4–10.8)

## 2018-12-31 LAB — VITAMIN D 25 HYDROXY (VIT D DEFICIENCY, FRACTURES): Vit D, 25-Hydroxy: 23.1 ng/mL — ABNORMAL LOW (ref 30.0–100.0)

## 2018-12-31 LAB — T4, FREE: Free T4: 1.31 ng/dL (ref 0.82–1.77)

## 2018-12-31 LAB — TSH: TSH: 1.06 u[IU]/mL (ref 0.450–4.500)

## 2019-01-08 NOTE — Progress Notes (Signed)
Mild vitamin d deficiency. Recommend OTC vitamin d daily. All other labs normal.

## 2019-03-14 ENCOUNTER — Ambulatory Visit
Admission: RE | Admit: 2019-03-14 | Discharge: 2019-03-14 | Disposition: A | Discharge status: Home / Self Care | Payer: Managed Care, Other (non HMO) | Source: Ambulatory Visit | Attending: Nurse Practitioner | Admitting: Nurse Practitioner

## 2019-03-14 DIAGNOSIS — Z1239 Encounter for other screening for malignant neoplasm of breast: Secondary | ICD-10-CM | POA: Diagnosis present

## 2019-03-14 DIAGNOSIS — Z1231 Encounter for screening mammogram for malignant neoplasm of breast: Secondary | ICD-10-CM | POA: Diagnosis not present

## 2019-03-14 NOTE — Progress Notes (Signed)
Negative mammogram

## 2019-08-28 ENCOUNTER — Encounter: Payer: Self-pay | Admitting: Nurse Practitioner

## 2019-08-28 ENCOUNTER — Ambulatory Visit: Payer: Managed Care, Other (non HMO) | Admitting: Nurse Practitioner

## 2019-08-28 VITALS — Resp 16 | Ht 67.0 in | Wt 185.0 lb

## 2019-08-28 DIAGNOSIS — J069 Acute upper respiratory infection, unspecified: Secondary | ICD-10-CM | POA: Diagnosis not present

## 2019-08-28 DIAGNOSIS — R05 Cough: Secondary | ICD-10-CM

## 2019-08-28 DIAGNOSIS — R059 Cough, unspecified: Secondary | ICD-10-CM | POA: Insufficient documentation

## 2019-08-28 MED ORDER — AZITHROMYCIN 250 MG PO TABS: ORAL_TABLET | ORAL | 0 refills | Status: DC

## 2019-08-28 MED ORDER — HYDROCOD POLST-CPM POLST ER 10-8 MG/5ML PO SUER: 5.0000 mL | Freq: Two times a day (BID) | ORAL | 0 refills | Status: DC | PRN

## 2019-08-28 NOTE — Progress Notes (Signed)
[facility] [address] [place], [place] [address]  Internal MEDICINE  Telephone Visit  Patient Name: Nina Lowe  [id number]  [id number]  Date of Service: 08/28/2019  I connected with the patient at 5:10pm by telephone and verified the patients identity using two identifiers.   I discussed the limitations, risks, security and privacy concerns of performing an evaluation and management service by telephone and the availability of in person appointments. I also discussed with the patient that there may be a patient responsible charge related to the service.  The patient expressed understanding and agrees to proceed.    Chief Complaint  Patient presents with  . Telephone Screen  . Telephone Assessment  . Cough    Persistent cough; medication given (name unclear) caused severe headaches    The patient has been contacted via telephone for follow up visit due to concerns for spread of novel coronavirus. She presents for acute visit. She states that she has had this cough for about two weeks. Was given prescription for tessalon perls. They give her a terrible headache. She states that she has had some nasal congestion. Taking sudofed for this which does help congestion, however congestion does come right back. Cough keeps her awake at night. She denies fever. Denies recent exposure to COVID 19.       Current Medication: Outpatient Encounter Medications as of 08/28/2019  Medication Sig  . acyclovir (ZOVIRAX) 400 MG tablet Take 1 tab by po 3 times a day for 5 days for flare ups  . azithromycin (ZITHROMAX) 250 MG tablet z-pack - take as directed for 5 days  . chlorpheniramine-HYDROcodone (TUSSIONEX PENNKINETIC ER) 10-8 MG/5ML SUER Take 5 mLs by mouth every 12 (twelve) hours as needed for cough.  . [DISCONTINUED] sulfamethoxazole-trimethoprim (BACTRIM DS) 800-160 MG tablet Take 1 tablet by mouth 2 (two) times daily. (Patient not taking: Reported on 08/28/2019)   No  facility-administered encounter medications on file as of 08/28/2019.    Surgical History: Past Surgical History:  Procedure Laterality Date  . ABDOMINAL HYSTERECTOMY      Medical History: Past Medical History:  Diagnosis Date  . Herpes     Family History: Family History  Problem Relation Age of Onset  . Hyperlipidemia Mother   . Healthy Father   . Breast cancer Cousin 98    Social History   Socioeconomic History  . Marital status: Married    Spouse name: Not on file  . Number of children: Not on file  . Years of education: Not on file  . Highest education level: Not on file  Occupational History  . Not on file  Tobacco Use  . Smoking status: Never Smoker  . Smokeless tobacco: Never Used  Vaping Use  . Vaping Use: Never used  Substance and Sexual Activity  . Alcohol use: Yes    Comment: occassional  . Drug use: Never  . Sexual activity: Not on file  Other Topics Concern  . Not on file  Social History Narrative  . Not on file   Social Determinants of Health   Financial Resource Strain:   . Difficulty of Paying Living Expenses:   Food Insecurity:   . Worried About Programme researcher, broadcasting/film/video in the Last Year:   . Barista in the Last Year:   Transportation Needs:   . Freight forwarder (Medical):   Marland Kitchen Lack of Transportation (Non-Medical):   Physical Activity:   . Days of Exercise per Week:   .  Minutes of Exercise per Session:   Stress:   . Feeling of Stress :   Social Connections:   . Frequency of Communication with Friends and Family:   . Frequency of Social Gatherings with Friends and Family:   . Attends Religious Services:   . Active Member of Clubs or Organizations:   . Attends Archivist Meetings:   Marland Kitchen Marital Status:   Intimate Partner Violence:   . Fear of Current or Ex-Partner:   . Emotionally Abused:   Marland Kitchen Physically Abused:   . Sexually Abused:       Review of Systems  Constitutional: Positive for fatigue. Negative for  chills, fever and unexpected weight change.  HENT: Positive for congestion, postnasal drip, sinus pressure and sinus pain. Negative for rhinorrhea, sneezing and sore throat.   Respiratory: Positive for cough. Negative for chest tightness and shortness of breath.   Cardiovascular: Negative for chest pain and palpitations.  Gastrointestinal: Negative for abdominal pain, constipation, diarrhea, nausea and vomiting.  Musculoskeletal: Negative for arthralgias, back pain, joint swelling and neck pain.  Skin: Negative for rash.  Allergic/Immunologic: Positive for environmental allergies.  Neurological: Positive for headaches. Negative for tremors and numbness.  Hematological: Negative for adenopathy. Does not bruise/bleed easily.  Psychiatric/Behavioral: Negative for behavioral problems (Depression), sleep disturbance and suicidal ideas. The patient is not nervous/anxious.     Today's Vitals   08/28/19 1541  Resp: 16  Weight: 185 lb (83.9 kg)  Height: 5\' 7"  (1.702 m)   Body mass index is 28.98 kg/m.  Observation/Objective:   The patient is alert and oriented. She is pleasant and answers all questions appropriately. Breathing is non-labored. She is in no acute distress at this time. The patient sounds nasally congested. She has dry, non-productive cough which is heard throughout the phone call.    Assessment/Plan: 1. Acute upper respiratory infection Start z-pack. Take as directed for 5 days. Rest and increase fluids. Use OTC medication as needed and as indicated to reduce acute syomtons  - azithromycin (ZITHROMAX) 250 MG tablet; z-pack - take as directed for 5 days  Dispense: 6 tablet; Refill: 0  2. Cough tussionex cough suppressant may be taken at night as needed for cough. Advised patient not to overuse this medicine and not to mix with other medications or alcohol as it can cause respiratory distress, sleepiness or dizziness. Should also avoid driving. Patient voiced understanding and  agreement. Encouraged her to take OTC cough suppressant such as Delsym as needed during the day.  - chlorpheniramine-HYDROcodone (TUSSIONEX PENNKINETIC ER) 10-8 MG/5ML SUER; Take 5 mLs by mouth every 12 (twelve) hours as needed for cough.  Dispense: 115 mL; Refill: 0  General Counseling: Nina Lowe verbalizes understanding of the findings of today's phone visit and agrees with plan of treatment. I have discussed any further diagnostic evaluation that may be needed or ordered today. We also reviewed her medications today. she has been encouraged to call the office with any questions or concerns that should arise related to todays visit.  This patient was seen by [place] with Dr Lavera Guise as a part of collaborative care agreement  Meds ordered this encounter  Medications  . chlorpheniramine-HYDROcodone (TUSSIONEX PENNKINETIC ER) 10-8 MG/5ML SUER    Sig: Take 5 mLs by mouth every 12 (twelve) hours as needed for cough.    Dispense:  115 mL    Refill:  0    Order Specific Question:   Supervising Provider    Answer:  KHAN, FOZIA M [[id number]]  . azithromycin (ZITHROMAX) 250 MG tablet    Sig: z-pack - take as directed for 5 days    Dispense:  6 tablet    Refill:  0    Order Specific Question:   Supervising Provider    Answer:   Lyndon Code [[id number]]    Time spent: 10 Minutes    Dr Lyndon Code Internal medicine

## 2019-10-12 ENCOUNTER — Telehealth: Payer: Self-pay

## 2019-10-12 NOTE — Telephone Encounter (Signed)
Confirmed and screened for office visit on 8/9 

## 2019-10-16 ENCOUNTER — Ambulatory Visit: Payer: Managed Care, Other (non HMO) | Admitting: Nurse Practitioner

## 2019-10-23 ENCOUNTER — Telehealth: Payer: Self-pay

## 2019-10-23 NOTE — Telephone Encounter (Signed)
I feel like she needs to be seen for this. Unsure if this is heart burn or something else.

## 2019-10-23 NOTE — Telephone Encounter (Signed)
Left pt vm to call back and schedule an appt.  dbs

## 2019-12-08 ENCOUNTER — Ambulatory Visit (INDEPENDENT_AMBULATORY_CARE_PROVIDER_SITE_OTHER): Payer: Managed Care, Other (non HMO) | Admitting: Nurse Practitioner

## 2019-12-08 ENCOUNTER — Other Ambulatory Visit: Payer: Self-pay

## 2019-12-08 ENCOUNTER — Encounter: Payer: Self-pay | Admitting: Nurse Practitioner

## 2019-12-08 VITALS — BP 128/77 | HR 58 | Temp 97.5°F | Resp 16 | Ht 67.0 in | Wt 186.0 lb

## 2019-12-08 DIAGNOSIS — R5383 Other fatigue: Secondary | ICD-10-CM

## 2019-12-08 DIAGNOSIS — Z124 Encounter for screening for malignant neoplasm of cervix: Secondary | ICD-10-CM

## 2019-12-08 DIAGNOSIS — Z23 Encounter for immunization: Secondary | ICD-10-CM

## 2019-12-08 DIAGNOSIS — Z0001 Encounter for general adult medical examination with abnormal findings: Secondary | ICD-10-CM

## 2019-12-08 DIAGNOSIS — K219 Gastro-esophageal reflux disease without esophagitis: Secondary | ICD-10-CM

## 2019-12-08 DIAGNOSIS — Z1231 Encounter for screening mammogram for malignant neoplasm of breast: Secondary | ICD-10-CM

## 2019-12-08 DIAGNOSIS — R300 Dysuria: Secondary | ICD-10-CM

## 2019-12-08 MED ORDER — OMEPRAZOLE 40 MG PO CPDR: 40.0000 mg | DELAYED_RELEASE_CAPSULE | Freq: Every day | ORAL | 3 refills | Status: DC

## 2019-12-08 NOTE — Progress Notes (Signed)
[facility] [place], [place] [address]  Internal MEDICINE  Office Visit Note  Patient Name: Nina Lowe  [id number]  [id number]  Date of Service: 12/27/2019  Chief Complaint  Patient presents with  . Annual Exam    cant take vitamins seems to give her heart burn  . Gastroesophageal Reflux    real bad,drinks hot water in the morining it seems to help  . Cough    when drinking ginger ale she coughs and throws up its been a couple months  . Quality Metric Gaps    tetnaus, Hep C     The patient is here for health maintenance exam and pap smear today. She states that she has been having a lot of problems with acid reflux. She states that this gets worse if she drinks gingerale or eats certain foods. It is also bad if she takes any sort of vitamin. She states that it can sometimes feel like heartburn, but other times it causes her to cough and frequently clear her throat. Taking vitamins or other medications causes her to feel very nauseated and sometimes vomit. She states this does not happen every time she eats. She has taken pepcid AC for this and also Tums. The Tums did help for a short period but was afraid to take too much. She states that Pepcid made her very nauseated.   Pt is here for routine health maintenance examination  Current Medication: Outpatient Encounter Medications as of 12/08/2019  Medication Sig  . acyclovir (ZOVIRAX) 400 MG tablet Take 1 tab by po 3 times a day for 5 days for flare ups  . [DISCONTINUED] azithromycin (ZITHROMAX) 250 MG tablet z-pack - take as directed for 5 days  . [DISCONTINUED] chlorpheniramine-HYDROcodone (TUSSIONEX PENNKINETIC ER) 10-8 MG/5ML SUER Take 5 mLs by mouth every 12 (twelve) hours as needed for cough.  Marland Kitchen omeprazole (PRILOSEC) 40 MG capsule Take 1 capsule (40 mg total) by mouth daily.   No facility-administered encounter medications on file as of 12/08/2019.    Surgical History: Past Surgical  History:  Procedure Laterality Date  . ABDOMINAL HYSTERECTOMY      Medical History: Past Medical History:  Diagnosis Date  . Herpes     Family History: Family History  Problem Relation Age of Onset  . Hyperlipidemia Mother   . Healthy Father   . Breast cancer Cousin 51      Review of Systems  Constitutional: Negative for activity change, chills, fatigue and unexpected weight change.  HENT: Negative for congestion, postnasal drip, rhinorrhea, sneezing and sore throat.   Respiratory: Negative for cough, chest tightness, shortness of breath and wheezing.   Cardiovascular: Negative for chest pain and palpitations.  Gastrointestinal: Positive for nausea. Negative for abdominal pain, constipation, diarrhea and vomiting.       Eating causing her to have excess acid production with cough. This sometimes causes her to vomit.   Endocrine: Negative for cold intolerance, heat intolerance, polydipsia and polyuria.  Genitourinary: Negative for dysuria, frequency and urgency.  Musculoskeletal: Negative for arthralgias, back pain, joint swelling and neck pain.  Skin: Negative for rash.  Allergic/Immunologic: Negative for environmental allergies.  Neurological: Negative for dizziness, tremors, numbness and headaches.  Hematological: Negative for adenopathy. Does not bruise/bleed easily.  Psychiatric/Behavioral: Negative for behavioral problems (Depression), sleep disturbance and suicidal ideas. The patient is not nervous/anxious.      Today's Vitals   12/08/19 0902  BP: 128/77  Pulse: (!) 58  Resp: 16  Temp: (!) 97.5 F (36.4 C)  SpO2: 98%  Weight: 186 lb (84.4 kg)  Height: $Remove'5\' 7"'sbuojlm$  (1.702 m)   Body mass index is 29.13 kg/m.  Physical Exam Vitals and nursing note reviewed.  Constitutional:      General: She is not in acute distress.    Appearance: Normal appearance. She is well-developed. She is not diaphoretic.  HENT:     Head: Normocephalic and atraumatic.     Right Ear:  External ear normal.     Left Ear: External ear normal.     Nose: Nose normal.     Mouth/Throat:     Mouth: Mucous membranes are moist.     Pharynx: Oropharynx is clear. No oropharyngeal exudate.  Eyes:     Pupils: Pupils are equal, round, and reactive to light.  Neck:     Thyroid: No thyromegaly.     Vascular: No carotid bruit or JVD.     Trachea: No tracheal deviation.  Cardiovascular:     Rate and Rhythm: Normal rate and regular rhythm.     Pulses: Normal pulses.     Heart sounds: Normal heart sounds. No murmur heard.  No friction rub. No gallop.   Pulmonary:     Effort: Pulmonary effort is normal. No respiratory distress.     Breath sounds: Normal breath sounds. No wheezing or rales.  Chest:     Chest wall: No tenderness.     Breasts:        Right: Normal. No swelling, bleeding, inverted nipple, mass, nipple discharge, skin change or tenderness.        Left: Normal. No swelling, bleeding, inverted nipple, mass, nipple discharge, skin change or tenderness.  Abdominal:     General: Bowel sounds are normal.     Palpations: Abdomen is soft.     Tenderness: There is no abdominal tenderness.     Hernia: There is no hernia in the left inguinal area or right inguinal area.  Genitourinary:    General: Normal vulva.     Labia:        Right: No tenderness or lesion.        Left: No tenderness or lesion.      Vagina: Normal. No vaginal discharge, erythema, tenderness or bleeding.     Cervix: Normal.     Uterus: Normal.      Adnexa: Right adnexa normal.     Comments: No tenderness, masses, or organomeglay present during bimanual exam . Musculoskeletal:        General: Normal range of motion.     Cervical back: Normal range of motion and neck supple.  Lymphadenopathy:     Cervical: No cervical adenopathy.     Upper Body:     Right upper body: No axillary adenopathy.     Left upper body: No axillary adenopathy.     Lower Body: No right inguinal adenopathy. No left inguinal  adenopathy.  Skin:    General: Skin is warm and dry.  Neurological:     Mental Status: She is alert and oriented to person, place, and time.     Cranial Nerves: No cranial nerve deficit.  Psychiatric:        Behavior: Behavior normal.        Thought Content: Thought content normal.        Judgment: Judgment normal.      LABS: Recent Results (from the past 2160 hour(s))  UA/M w/rflx Culture, Routine     Status: Abnormal  Collection Time: 12/08/19  9:10 AM   Specimen: Urine   Urine  Result Value Ref Range   Specific Gravity, UA 1.020 1.005 - 1.030   pH, UA 6.5 5.0 - 7.5   Color, UA Yellow Yellow   Appearance Ur Cloudy (A) Clear   Leukocytes,UA Negative Negative   Protein,UA Negative Negative/Trace   Glucose, UA Negative Negative   Ketones, UA Trace (A) Negative   RBC, UA Negative Negative   Bilirubin, UA Negative Negative   Urobilinogen, Ur 1.0 0.2 - 1.0 mg/dL   Nitrite, UA Negative Negative   Microscopic Examination Comment     Comment: Microscopic follows if indicated.   Microscopic Examination See below:     Comment: Microscopic was indicated and was performed.   Urinalysis Reflex Comment     Comment: This specimen has reflexed to a Urine Culture.  Microscopic Examination     Status: Abnormal   Collection Time: 12/08/19  9:10 AM   Urine  Result Value Ref Range   WBC, UA 6-10 (A) 0 - 5 /hpf   RBC 0-2 0 - 2 /hpf   Epithelial Cells (non renal) >10 (A) 0 - 10 /hpf   Casts None seen None seen /lpf   Bacteria, UA None seen None seen/Few  Urine Culture, Reflex     Status: Abnormal   Collection Time: 12/08/19  9:10 AM   Urine  Result Value Ref Range   Urine Culture, Routine Final report (A)    Organism ID, Bacteria Escherichia coli (A)     Comment: Greater than 100,000 colony forming units per mL   ORGANISM ID, BACTERIA Enterococcus faecalis (A)     Comment: 25,000-50,000 colony forming units per mL   Antimicrobial Susceptibility Comment     Comment:       ** S =  Susceptible; I = Intermediate; R = Resistant **                    P = Positive; N = Negative             MICS are expressed in micrograms per mL    Antibiotic                 RSLT#1    RSLT#2    RSLT#3    RSLT#4 Amoxicillin/Clavulanic Acid    S Ampicillin                     S Cefepime                       S Ceftriaxone                    S Cefuroxime                     R Ciprofloxacin                  S         S Ertapenem                      S Gentamicin                     S Imipenem                       S Levofloxacin  S         S Meropenem                      S Nitrofurantoin                 S         S Penicillin                               S Piperacillin/Tazobactam        S Tetracycline                   S         S Tobramycin                     S Trimethoprim/Sulfa             S Vancomycin                               S   IGP, Aptima HPV     Status: Abnormal   Collection Time: 12/08/19  9:30 AM  Result Value Ref Range   Interpretation EPCA,CHVIRB (A)     Comment: EPITHELIAL CELL ABNORMALITY. LOW-GRADE SQUAMOUS INTRAEPITHELIAL LESION (LSIL); (ENCOMPASSING HUMAN PAPILLOMAVIRUS /MILD DYSPLASIA/CIN1).    Category LSIL (A)     Comment: Low-Grade Squamous Intraepithelial Lesion   Adequacy SECNI     Comment: Satisfactory for evaluation. No endocervical component is identified.   Clinician Provided ICD10 Comment     Comment: Z12.4   Performed by: Comment     Comment: Senaida Lange, Cytotechnologist (ASCP)   Electronically signed by: Comment     Comment: Heidi Dach, MD, Pathologist   PATHOLOGIST PROVIDED ICD10: Comment     Comment: C62.376   Note: Comment     Comment: The Pap smear is a screening test designed to aid in the detection of premalignant and malignant conditions of the uterine cervix.  It is not a diagnostic procedure and should not be used as the sole means of detecting cervical cancer.  Both false-positive and  false-negative reports do occur.    Test Methodology Comment     Comment: This liquid based ThinPrep(R) pap test was screened with the use of an image guided system.    HPV Aptima Positive (A) Negative    Comment: This nucleic acid amplification test detects fourteen high-risk HPV types (16,18,31,33,35,39,45,51,52,56,58,59,66,68) without differentiation.   Comprehensive metabolic panel     Status: Abnormal   Collection Time: 12/11/19  8:33 AM  Result Value Ref Range   Glucose 83 65 - 99 mg/dL   BUN 9 6 - 24 mg/dL   Creatinine, Ser 0.78 0.57 - 1.00 mg/dL   GFR calc non Af Amer 88 >59 mL/min/1.73   GFR calc Af Amer 101 >59 mL/min/1.73    Comment: **[facility] currently reports eGFR in compliance with the current**   recommendations of the [employer]. [facility] will   update reporting as new guidelines are published from the NKF-ASN   Task force.    BUN/Creatinine Ratio 12 9 - 23   Sodium 143 134 - 144 mmol/L   Potassium 4.6 3.5 - 5.2 mmol/L   Chloride 105 96 - 106 mmol/L   CO2 22 20 - 29 mmol/L   Calcium 9.7 8.7 - 10.2 mg/dL  Total Protein 7.8 6.0 - 8.5 g/dL   Albumin 4.8 3.8 - 4.9 g/dL   Globulin, Total 3.0 1.5 - 4.5 g/dL   Albumin/Globulin Ratio 1.6 1.2 - 2.2   Bilirubin Total 0.2 0.0 - 1.2 mg/dL   Alkaline Phosphatase 139 (H) 44 - 121 IU/L    Comment:               **Please note reference interval change**   AST 12 0 - 40 IU/L   ALT 11 0 - 32 IU/L  CBC     Status: Abnormal   Collection Time: 12/11/19  8:33 AM  Result Value Ref Range   WBC 4.0 3.4 - 10.8 x10E3/uL   RBC 4.58 3.77 - 5.28 x10E6/uL   Hemoglobin 12.7 11.1 - 15.9 g/dL   Hematocrit 40.7 34.0 - 46.6 %   MCV 89 79 - 97 fL   MCH 27.7 26.6 - 33.0 pg   MCHC 31.2 (L) 31 - 35 g/dL   RDW 12.4 11.7 - 15.4 %   Platelets 317 150 - 450 x10E3/uL  Lipid Panel With LDL/HDL Ratio     Status: Abnormal   Collection Time: 12/11/19  8:33 AM  Result Value Ref Range   Cholesterol, Total 183 100 - 199 mg/dL    Triglycerides 40 0 - 149 mg/dL   HDL 57 >39 mg/dL   VLDL Cholesterol Cal 8 5 - 40 mg/dL   LDL Chol Calc (NIH) 118 (H) 0 - 99 mg/dL   LDL/HDL Ratio 2.1 0.0 - 3.2 ratio    Comment:                                     LDL/HDL Ratio                                             Men  Women                               1/2 Avg.Risk  1.0    1.5                                   Avg.Risk  3.6    3.2                                2X Avg.Risk  6.2    5.0                                3X Avg.Risk  8.0    6.1   T4, free     Status: None   Collection Time: 12/11/19  8:33 AM  Result Value Ref Range   Free T4 1.26 0.82 - 1.77 ng/dL  TSH     Status: None   Collection Time: 12/11/19  8:33 AM  Result Value Ref Range   TSH 1.430 0.450 - 4.500 uIU/mL  VITAMIN D 25 Hydroxy (Vit-D Deficiency, Fractures)     Status: Abnormal   Collection Time: 12/11/19  8:33 AM  Result Value Ref Range  Vit D, 25-Hydroxy 18.9 (L) 30.0 - 100.0 ng/mL    Comment: Vitamin D deficiency has been defined by the [place] practice guideline as a level of serum 25-OH vitamin D less than 20 ng/mL (1,2). The Endocrine Society went on to further define vitamin D insufficiency as a level between 21 and 29 ng/mL (2). 1. IOM (Institute of Medicine). 2010. Dietary reference    intakes for calcium and D. [place]: The    [employer]. 2. Holick MF, Binkley [place], Bischoff-Ferrari HA, et al.    Evaluation, treatment, and prevention of vitamin D    deficiency: an Endocrine Society clinical practice    guideline. JCEM. 2011 Jul; 96(7):1911-30.     Assessment/Plan:  1. Encounter for general adult medical examination with abnormal findings Annual health maintenance exam and pap smear today.   2. Gastroesophageal reflux disease without esophagitis Trial of omeprazole $RemoveBefor'40mg'iDjwxAnySIxL$  daily. Recommend she avoid spicy and trigger foods. Further evaluation as indicated. - omeprazole (PRILOSEC) 40  MG capsule; Take 1 capsule (40 mg total) by mouth daily.  Dispense: 30 capsule; Refill: 3  3. Other fatigue Check labs including iron and thyroid panel.   4. Routine cervical smear - IGP, Aptima HPV  5. Needs flu shot Flu vaccine administered today.  - Flu Vaccine MDCK QUAD PF  6. Encounter for screening mammogram for malignant neoplasm of breast - MM 3D SCREEN BREAST BILATERAL; Future  7. Dysuria - UA/M w/rflx Culture, Routine  General Counseling: Avina verbalizes understanding of the findings of todays visit and agrees with plan of treatment. I have discussed any further diagnostic evaluation that may be needed or ordered today. We also reviewed her medications today. she has been encouraged to call the office with any questions or concerns that should arise related to todays visit.    Counseling:  This patient was seen by Leretha Pol FNP Collaboration with Dr Lavera Guise as a part of collaborative care agreement  Orders Placed This Encounter  Procedures  . Microscopic Examination  . Urine Culture, Reflex  . MM 3D SCREEN BREAST BILATERAL  . Flu Vaccine MDCK QUAD PF  . UA/M w/rflx Culture, Routine    Meds ordered this encounter  Medications  . omeprazole (PRILOSEC) 40 MG capsule    Sig: Take 1 capsule (40 mg total) by mouth daily.    Dispense:  30 capsule    Refill:  3    Order Specific Question:   Supervising Provider    Answer:   Lavera Guise [[id number]]    Total time spent: 63 Minutes  Time spent includes review of chart, medications, test results, and follow up plan with the patient.     Lavera Guise, MD  Internal Medicine

## 2019-12-11 ENCOUNTER — Other Ambulatory Visit: Payer: Self-pay | Admitting: Nurse Practitioner

## 2019-12-12 LAB — CBC
Hematocrit: 40.7 % (ref 34.0–46.6)
Hemoglobin: 12.7 g/dL (ref 11.1–15.9)
MCH: 27.7 pg (ref 26.6–33.0)
MCHC: 31.2 g/dL — ABNORMAL LOW (ref 31.5–35.7)
MCV: 89 fL (ref 79–97)
Platelets: 317 10*3/uL (ref 150–450)
RBC: 4.58 x10E6/uL (ref 3.77–5.28)
RDW: 12.4 % (ref 11.7–15.4)
WBC: 4 10*3/uL (ref 3.4–10.8)

## 2019-12-12 LAB — COMPREHENSIVE METABOLIC PANEL
ALT: 11 IU/L (ref 0–32)
AST: 12 IU/L (ref 0–40)
Albumin/Globulin Ratio: 1.6 (ref 1.2–2.2)
Albumin: 4.8 g/dL (ref 3.8–4.9)
Alkaline Phosphatase: 139 IU/L — ABNORMAL HIGH (ref 44–121)
BUN/Creatinine Ratio: 12 (ref 9–23)
BUN: 9 mg/dL (ref 6–24)
Bilirubin Total: 0.2 mg/dL (ref 0.0–1.2)
CO2: 22 mmol/L (ref 20–29)
Calcium: 9.7 mg/dL (ref 8.7–10.2)
Chloride: 105 mmol/L (ref 96–106)
Creatinine, Ser: 0.78 mg/dL (ref 0.57–1.00)
GFR calc Af Amer: 101 mL/min/{1.73_m2} (ref >59)
GFR calc non Af Amer: 88 mL/min/{1.73_m2} (ref >59)
Globulin, Total: 3 g/dL (ref 1.5–4.5)
Glucose: 83 mg/dL (ref 65–99)
Potassium: 4.6 mmol/L (ref 3.5–5.2)
Sodium: 143 mmol/L (ref 134–144)
Total Protein: 7.8 g/dL (ref 6.0–8.5)

## 2019-12-12 LAB — LIPID PANEL WITH LDL/HDL RATIO
Cholesterol, Total: 183 mg/dL (ref 100–199)
HDL: 57 mg/dL (ref >39)
LDL Chol Calc (NIH): 118 mg/dL — ABNORMAL HIGH (ref 0–99)
LDL/HDL Ratio: 2.1 ratio (ref 0.0–3.2)
Triglycerides: 40 mg/dL (ref 0–149)
VLDL Cholesterol Cal: 8 mg/dL (ref 5–40)

## 2019-12-12 LAB — TSH: TSH: 1.43 u[IU]/mL (ref 0.450–4.500)

## 2019-12-12 LAB — T4, FREE: Free T4: 1.26 ng/dL (ref 0.82–1.77)

## 2019-12-12 LAB — VITAMIN D 25 HYDROXY (VIT D DEFICIENCY, FRACTURES): Vit D, 25-Hydroxy: 18.9 ng/mL — ABNORMAL LOW (ref 30.0–100.0)

## 2019-12-13 NOTE — Progress Notes (Signed)
Suspect contamination. Will treat based on culture and sensitivity results.

## 2019-12-14 LAB — UA/M W/RFLX CULTURE, ROUTINE
Bilirubin, UA: NEGATIVE
Glucose, UA: NEGATIVE
Leukocytes,UA: NEGATIVE
Nitrite, UA: NEGATIVE
Protein,UA: NEGATIVE
RBC, UA: NEGATIVE
Specific Gravity, UA: 1.02 (ref 1.005–1.030)
Urobilinogen, Ur: 1 mg/dL (ref 0.2–1.0)
pH, UA: 6.5 (ref 5.0–7.5)

## 2019-12-14 LAB — URINE CULTURE, REFLEX

## 2019-12-14 LAB — MICROSCOPIC EXAMINATION
Bacteria, UA: NONE SEEN
Casts: NONE SEEN /lpf
Epithelial Cells (non renal): >10 /hpf — AB (ref 0–10)

## 2019-12-15 LAB — IGP, APTIMA HPV: HPV Aptima: POSITIVE — AB

## 2019-12-15 NOTE — Progress Notes (Signed)
Call patient to discuss pap results. Will need referral to GYN

## 2019-12-15 NOTE — Progress Notes (Signed)
Vitamin d deficiency. Will treat with drisdol weekly. Notify patient when call to discuss pap smear.

## 2019-12-18 ENCOUNTER — Telehealth: Payer: Self-pay

## 2019-12-18 ENCOUNTER — Other Ambulatory Visit: Payer: Self-pay | Admitting: Nurse Practitioner

## 2019-12-18 DIAGNOSIS — E559 Vitamin D deficiency, unspecified: Secondary | ICD-10-CM

## 2019-12-18 MED ORDER — ERGOCALCIFEROL 1.25 MG (50000 UT) PO CAPS: 50000.0000 [IU] | ORAL_CAPSULE | ORAL | 5 refills | Status: DC

## 2019-12-18 NOTE — Telephone Encounter (Signed)
Pt notified and send beth message for referral

## 2019-12-18 NOTE — Progress Notes (Signed)
Please let patient know two things. 1. Labs were good, excpt vitamin d was low. I have sent in prescription for Drisdol 50000iu weekly for the next several months. 2. Her pap was abnormal and I would like to refer her as sson as possible to GYN for further evaluation and treatment. Thanks.

## 2019-12-18 NOTE — Telephone Encounter (Signed)
-----   Message from Carlean Jews, NP sent at 12/18/2019  8:57 AM EDT ----- Please let patient know two things. 1. Labs were good, excpt vitamin d was low. I have sent in prescription for Drisdol 50000iu weekly for the next several months. 2. Her pap was abnormal and I would like to refer her as sson as possible to GYN for f urther evaluation and treatment. Thanks.

## 2020-01-12 ENCOUNTER — Ambulatory Visit: Payer: Self-pay | Admitting: Obstetrics and Gynecology

## 2020-02-14 ENCOUNTER — Other Ambulatory Visit: Payer: Self-pay

## 2020-02-14 ENCOUNTER — Ambulatory Visit (INDEPENDENT_AMBULATORY_CARE_PROVIDER_SITE_OTHER): Payer: Managed Care, Other (non HMO) | Admitting: Obstetrics and Gynecology

## 2020-02-14 VITALS — BP 110/70 | Ht 67.0 in | Wt 182.8 lb

## 2020-02-14 DIAGNOSIS — R87622 Low grade squamous intraepithelial lesion on cytologic smear of vagina (LGSIL): Secondary | ICD-10-CM | POA: Diagnosis not present

## 2020-02-14 NOTE — Patient Instructions (Signed)
Colposcopy, Care After This sheet gives you information about how to care for yourself after your procedure. Your doctor may also give you more specific instructions. If you have problems or questions, contact your doctor. What can I expect after the procedure? If you did not have a tissue sample removed (did not have a biopsy), you may only have some spotting for a few days. You can go back to your normal activities. If you had a tissue sample removed, it is common to have:  Soreness and pain. This may last for a few days.  Light-headedness.  Mild bleeding from your vagina or dark-colored, grainy discharge from your vagina. This may last for a few days. You may need to wear a sanitary pad.  Spotting for at least 48 hours after the procedure. Follow these instructions at home:   Take over-the-counter and prescription medicines only as told by your doctor. Ask your doctor what medicines you can start taking again. This is very important if you take blood-thinning medicine.  Do not drive or use heavy machinery while taking prescription pain medicine.  For 3 days, or as long as your doctor tells you, avoid: ? Douching. ? Using tampons. ? Having sex.  If you use birth control (contraception), keep using it.  Limit activity for the first day after the procedure. Ask your doctor what activities are safe for you.  It is up to you to get the results of your procedure. Ask your doctor when your results will be ready.  Keep all follow-up visits as told by your doctor. This is important. Contact a doctor if:  You get a skin rash. Get help right away if:  You are bleeding a lot from your vagina. It is a lot of bleeding if you are using more than one pad an hour for 2 hours in a row.  You have clumps of blood (blood clots) coming from your vagina.  You have a fever.  You have chills  You have pain in your lower belly (pelvic area).  You have signs of infection, such as vaginal  discharge that is: ? Different than usual. ? Yellow. ? Bad-smelling.  You have very pain or cramps in your lower belly that do not get better with medicine.  You feel light-headed.  You feel dizzy.  You pass out (faint). Summary  If you did not have a tissue sample removed (did not have a biopsy), you may only have some spotting for a few days. You can go back to your normal activities.  If you had a tissue sample removed, it is common to have mild pain and spotting for 48 hours.  For 3 days, or as long as your doctor tells you, avoid douching, using tampons and having sex.  Get help right away if you have bleeding, very bad pain, or signs of infection. This information is not intended to replace advice given to you by your health care provider. Make sure you discuss any questions you have with your health care provider. Document Revised: 02/05/2017 Document Reviewed: 11/13/2015 Elsevier Patient Education  2020 Elsevier Inc.  

## 2020-02-14 NOTE — Progress Notes (Signed)
   GYNECOLOGY CLINIC COLPOSCOPY PROCEDURE NOTE  54 y.o. No obstetric history on file. here for colposcopy for low-grade squamous intraepithelial neoplasia (LGSIL - encompassing HPV,mild dysplasia,CIN I)  pap smear on 12/08/2019. Discussed underlying role for HPV infection in the development of cervical dysplasia, its natural history and progression/regression, need for surveillance.  Is the patient  pregnant: No LMP: No LMP recorded. Patient has had a hysterectomy. Smoking status:  reports that she has never smoked. She has never used smokeless tobacco. Contraception: status post hysterectomy Future fertility desired:  No  Patient given informed consent, signed copy in the chart, time out was performed.  The patient was position in dorsal lithotomy position. Speculum was placed the cervix was visualized.   After application of acetic acid colposcopic inspection of the cervix was undertaken.   Colposcopy adequate, full visualization of vaginal cuff: Yes no visible lesions; no biopsies obtained.   ECC specimen obtained:  No   Patient was given post procedure instructions.    Patient has had a hysterectomy and cervix appears to be surgically absent.  No cervix palpated. If patient had a normal cervix and no cervical lesion greater than CIN 2 than she can discontinue pap smear. Otherwise repeat pap smear in 1 yea.   Will follow up pathology and manage accordingly.  Routine preventative health maintenance measures emphasized.  Adelene Idler MD [facility], [facility] 02/14/2020 3:49 PM

## 2020-02-14 NOTE — Progress Notes (Signed)
Procedure Colposcopy 

## 2020-03-07 ENCOUNTER — Encounter: Payer: Self-pay | Admitting: Hospice and Palliative Medicine

## 2020-03-07 ENCOUNTER — Ambulatory Visit: Payer: Managed Care, Other (non HMO) | Admitting: Hospice and Palliative Medicine

## 2020-03-07 VITALS — BP 136/82 | HR 60 | Temp 97.1°F | Resp 16 | Ht 66.0 in | Wt 179.6 lb

## 2020-03-07 DIAGNOSIS — K219 Gastro-esophageal reflux disease without esophagitis: Secondary | ICD-10-CM

## 2020-03-07 DIAGNOSIS — R1032 Left lower quadrant pain: Secondary | ICD-10-CM | POA: Diagnosis not present

## 2020-03-07 MED ORDER — TRAMADOL HCL 50 MG PO TABS: 50.0000 mg | ORAL_TABLET | Freq: Three times a day (TID) | ORAL | 0 refills | Status: AC | PRN

## 2020-03-07 NOTE — Progress Notes (Signed)
[facility] [address] [place], [place] [address]  Internal MEDICINE  Office Visit Note  Patient Name: Nina Lowe  [id number]  [id number]  Date of Service: 03/14/2020  Chief Complaint  Patient presents with  . Acute Visit    Pain on left side feels like its near ovary, cramps, diarrhea, experienced it 3 years ago, felt it on Christmas night, doesn't know what triggers pain, pain lasts about 12 hours, pt still has ovaries     HPI Pt is here for a sick visit C/o left lower abdominal pain--near left ovary, cramping pain Pain has hurt off and on for a few years, pain got really bad on Christmas eve She was seen by her GYN on 12/08 for positive HPV with LOW-GRADE SQUAMOUS INTRAEPITHELIAL LESION --GYN unsure as to why she was sent there as she no longer has a cervix as she had a partial hysterectomy She did not discuss left ovary pain with GYN at that visit Pain is associated with diarrhea and at times vomiting--pain is cramping in nature, pain is always in her left lower abdomen  Routinely has cramping sensation, the intense pain with cramping and GI upset happens every 3-4 months  Her mother become worried about her over the holidays and stressed that she be seen by her provider  Denies any vaginal bleeding or discharge She has been able to control pain with acetaminophen/ibuprofen in the past but is concerned that it continues to reoccur  Started on omeprazole at last visit and GERD symptoms are better controlled  Current Medication:  Outpatient Encounter Medications as of 03/07/2020  Medication Sig  . [EXPIRED] traMADol (ULTRAM) 50 MG tablet Take 1 tablet (50 mg total) by mouth every 8 (eight) hours as needed for up to 5 days.  . [DISCONTINUED] acyclovir (ZOVIRAX) 400 MG tablet Take 1 tab by po 3 times a day for 5 days for flare ups (Patient not taking: Reported on 03/07/2020)  . [DISCONTINUED] ergocalciferol (DRISDOL) 1.25 MG (50000 UT) capsule Take 1  capsule (50,000 Units total) by mouth once a week. (Patient not taking: No sig reported)  . [DISCONTINUED] omeprazole (PRILOSEC) 40 MG capsule Take 1 capsule (40 mg total) by mouth daily. (Patient not taking: No sig reported)   No facility-administered encounter medications on file as of 03/07/2020.      Medical History: Past Medical History:  Diagnosis Date  . GERD (gastroesophageal reflux disease)   . Herpes      Vital Signs: BP 136/82   Pulse 60   Temp (!) 97.1 F (36.2 C)   Resp 16   Ht 5\' 6"  (1.676 m)   Wt 179 lb 9.6 oz (81.5 kg)   SpO2 99%   BMI 28.99 kg/m    Review of Systems  Constitutional: Negative for chills, diaphoresis and fatigue.  HENT: Negative for ear pain, postnasal drip and sinus pressure.   Eyes: Negative for photophobia, discharge, redness, itching and visual disturbance.  Respiratory: Negative for cough, shortness of breath and wheezing.   Cardiovascular: Negative for chest pain, palpitations and leg swelling.  Gastrointestinal: Positive for abdominal pain, diarrhea and nausea. Negative for constipation and vomiting.       Left lower quadrant pain  Genitourinary: Negative for dysuria and flank pain.  Musculoskeletal: Negative for arthralgias, back pain, gait problem and neck pain.  Skin: Negative for color change.  Allergic/Immunologic: Negative for environmental allergies and food allergies.  Neurological: Negative for dizziness and headaches.  Hematological: Does not bruise/bleed easily.  Psychiatric/Behavioral: Negative for agitation, behavioral problems (depression) and hallucinations.    Physical Exam Vitals reviewed.  Constitutional:      Appearance: Normal appearance. She is normal weight.  Cardiovascular:     Rate and Rhythm: Normal rate and regular rhythm.     Pulses: Normal pulses.     Heart sounds: Normal heart sounds.  Pulmonary:     Effort: Pulmonary effort is normal.     Breath sounds: Normal breath sounds.  Abdominal:      General: Abdomen is flat.     Palpations: Abdomen is soft.  Musculoskeletal:        General: Normal range of motion.     Cervical back: Normal range of motion.  Skin:    General: Skin is warm.  Neurological:     General: No focal deficit present.     Mental Status: She is alert and oriented to person, place, and time. Mental status is at baseline.  Psychiatric:        Mood and Affect: Mood normal.        Behavior: Behavior normal.        Thought Content: Thought content normal.        Judgment: Judgment normal.    Assessment/Plan: 1. Left lower quadrant abdominal pain Will review CT results and address appropriately Advised tramadol to be used as needed for severe pain - CT Abdomen Pelvis Wo Contrast; Future - traMADol (ULTRAM) 50 MG tablet; Take 1 tablet (50 mg total) by mouth every 8 (eight) hours as needed for up to 5 days.  Dispense: 15 tablet; Refill: 0  2. Gastroesophageal reflux disease without esophagitis Symptoms better controlled since starting omeprazole, continue to monitor  General Counseling: Tuyet verbalizes understanding of the findings of todays visit and agrees with plan of treatment. I have discussed any further diagnostic evaluation that may be needed or ordered today. We also reviewed her medications today. she has been encouraged to call the office with any questions or concerns that should arise related to todays visit.   Orders Placed This Encounter  Procedures  . CT Abdomen Pelvis Wo Contrast    Meds ordered this encounter  Medications  . traMADol (ULTRAM) 50 MG tablet    Sig: Take 1 tablet (50 mg total) by mouth every 8 (eight) hours as needed for up to 5 days.    Dispense:  15 tablet    Refill:  0    Time spent: 30 Minutes  This patient was seen by Leeanne Deed AGNP-C in Collaboration with Dr Lyndon Code as a part of collaborative care agreement.  Lubertha Basque [facility] Internal Medicine

## 2020-03-14 ENCOUNTER — Encounter: Payer: Self-pay | Admitting: Hospice and Palliative Medicine

## 2020-03-20 ENCOUNTER — Other Ambulatory Visit: Payer: Self-pay

## 2020-03-20 DIAGNOSIS — R1032 Left lower quadrant pain: Secondary | ICD-10-CM

## 2020-03-20 NOTE — Progress Notes (Signed)
Changed ct orders to ultrasound per insurance requirement. Nina Lowe 

## 2020-03-26 ENCOUNTER — Ambulatory Visit: Payer: Managed Care, Other (non HMO)

## 2020-03-26 ENCOUNTER — Ambulatory Visit: Admission: RE | Admit: 2020-03-26 | Payer: Managed Care, Other (non HMO) | Source: Ambulatory Visit

## 2020-04-04 ENCOUNTER — Ambulatory Visit: Admission: RE | Admit: 2020-04-04 | Payer: Managed Care, Other (non HMO) | Source: Ambulatory Visit

## 2020-04-04 ENCOUNTER — Other Ambulatory Visit: Payer: Self-pay

## 2020-04-04 ENCOUNTER — Ambulatory Visit
Admission: RE | Admit: 2020-04-04 | Discharge: 2020-04-04 | Disposition: A | Discharge status: Home / Self Care | Payer: Managed Care, Other (non HMO) | Source: Ambulatory Visit | Attending: Hospice and Palliative Medicine | Admitting: Hospice and Palliative Medicine

## 2020-04-04 DIAGNOSIS — R1032 Left lower quadrant pain: Secondary | ICD-10-CM | POA: Diagnosis present

## 2020-04-05 ENCOUNTER — Ambulatory Visit
Admission: RE | Admit: 2020-04-05 | Discharge: 2020-04-05 | Disposition: A | Discharge status: Home / Self Care | Payer: Managed Care, Other (non HMO) | Source: Ambulatory Visit | Attending: Nurse Practitioner | Admitting: Nurse Practitioner

## 2020-04-05 DIAGNOSIS — Z1231 Encounter for screening mammogram for malignant neoplasm of breast: Secondary | ICD-10-CM | POA: Insufficient documentation

## 2020-04-08 ENCOUNTER — Telehealth: Payer: Self-pay

## 2020-04-08 NOTE — Telephone Encounter (Signed)
LMOM that Nina Lowe prefers for pt to come in so other things like pts pain can be discussed as well

## 2020-04-08 NOTE — Telephone Encounter (Signed)
I would prefer that she come in so we can discuss her pain and other imaging needed if necessary.

## 2020-04-10 ENCOUNTER — Encounter: Payer: Self-pay | Admitting: Hospice and Palliative Medicine

## 2020-04-10 ENCOUNTER — Other Ambulatory Visit: Payer: Self-pay

## 2020-04-10 ENCOUNTER — Ambulatory Visit: Payer: Managed Care, Other (non HMO) | Admitting: Hospice and Palliative Medicine

## 2020-04-10 VITALS — BP 122/84 | HR 93 | Temp 97.1°F | Resp 16 | Ht 66.0 in | Wt 180.6 lb

## 2020-04-10 DIAGNOSIS — G8929 Other chronic pain: Secondary | ICD-10-CM

## 2020-04-10 DIAGNOSIS — R1031 Right lower quadrant pain: Secondary | ICD-10-CM

## 2020-04-10 DIAGNOSIS — R1032 Left lower quadrant pain: Secondary | ICD-10-CM

## 2020-04-10 DIAGNOSIS — Z1211 Encounter for screening for malignant neoplasm of colon: Secondary | ICD-10-CM

## 2020-04-10 NOTE — Progress Notes (Signed)
[facility] [address] [place], [place] [address]  Internal MEDICINE  Office Visit Note  Patient Name: Nina Lowe  [id number]  [id number]  Date of Service: 04/13/2020  Chief Complaint  Patient presents with  . Follow-up    [place] follow up  . Gastroesophageal Reflux    HPI Patient is here for routine follow-up Reviewed transvaginal US--negative, surgically absent uterus and ovaries Has not had recurring episode of pain since our last visit Had mammogram 1/28---normal, recommend repeat screening in 1 year  Again discussed episodes of pain--seems to be related to eating, and associated diarrhea, possible diverticulosis/diverticulitis  Due for screening colonoscopy  Current Medication: No outpatient encounter medications on file as of 04/10/2020.   No facility-administered encounter medications on file as of 04/10/2020.    Surgical History: Past Surgical History:  Procedure Laterality Date  . ABDOMINAL HYSTERECTOMY      Medical History: Past Medical History:  Diagnosis Date  . GERD (gastroesophageal reflux disease)   . Herpes     Family History: Family History  Problem Relation Age of Onset  . Hyperlipidemia Mother   . Healthy Father   . Breast cancer Cousin 81    Social History   Socioeconomic History  . Marital status: Married    Spouse name: Not on file  . Number of children: Not on file  . Years of education: Not on file  . Highest education level: Not on file  Occupational History  . Not on file  Tobacco Use  . Smoking status: Never Smoker  . Smokeless tobacco: Never Used  Vaping Use  . Vaping Use: Never used  Substance and Sexual Activity  . Alcohol use: Yes    Comment: occassional  . Drug use: Never  . Sexual activity: Not on file  Other Topics Concern  . Not on file  Social History Narrative  . Not on file   Social Determinants of Health   Financial Resource Strain: Not on file  Food Insecurity: Not on file   Transportation Needs: Not on file  Physical Activity: Not on file  Stress: Not on file  Social Connections: Not on file  Intimate Partner Violence: Not on file      Review of Systems  Constitutional: Negative for chills, diaphoresis and fatigue.  HENT: Negative for ear pain, postnasal drip and sinus pressure.   Eyes: Negative for photophobia, discharge, redness, itching and visual disturbance.  Respiratory: Negative for cough, shortness of breath and wheezing.   Cardiovascular: Negative for chest pain, palpitations and leg swelling.  Gastrointestinal: Negative for abdominal pain, constipation, diarrhea, nausea and vomiting.  Genitourinary: Negative for dysuria and flank pain.  Musculoskeletal: Negative for arthralgias, back pain, gait problem and neck pain.  Skin: Negative for color change.  Allergic/Immunologic: Negative for environmental allergies and food allergies.  Neurological: Negative for dizziness and headaches.  Hematological: Does not bruise/bleed easily.  Psychiatric/Behavioral: Negative for agitation, behavioral problems (depression) and hallucinations.    Vital Signs: BP 122/84   Pulse 93   Temp (!) 97.1 F (36.2 C)   Resp 16   Ht 5\' 6"  (1.676 m)   Wt 180 lb 9.6 oz (81.9 kg)   SpO2 100%   BMI 29.15 kg/m    Physical Exam Vitals reviewed.  Constitutional:      Appearance: Normal appearance. She is normal weight.  Cardiovascular:     Rate and Rhythm: Normal rate and regular rhythm.     Pulses: Normal pulses.  Heart sounds: Normal heart sounds.  Pulmonary:     Effort: Pulmonary effort is normal.     Breath sounds: Normal breath sounds.  Abdominal:     General: Abdomen is flat.     Palpations: Abdomen is soft.  Musculoskeletal:        General: Normal range of motion.     Cervical back: Normal range of motion.  Skin:    General: Skin is warm.  Neurological:     General: No focal deficit present.     Mental Status: She is alert and oriented to  person, place, and time. Mental status is at baseline.  Psychiatric:        Mood and Affect: Mood normal.        Behavior: Behavior normal.        Thought Content: Thought content normal.        Judgment: Judgment normal.    Assessment/Plan: 1. Screening for colon cancer - Ambulatory referral to Gastroenterology  2. Chronic bilateral lower abdominal pain Transvaginal US negative for acute findings Recurring symptoms, last flare in December Possible diverticulosis with acute diverticulitis--will be seen on colonoscopy screening  General Counseling: [name] verbalizes understanding of the findings of todays visit and agrees with plan of treatment. I have discussed any further diagnostic evaluation that may be needed or ordered today. We also reviewed her medications today. she has been encouraged to call the office with any questions or concerns that should arise related to todays visit.    Orders Placed This Encounter  Procedures  . Ambulatory referral to Gastroenterology   Time spent: 30 Minutes Time spent includes review of chart, medications, test results and follow-up plan with the patient.  This patient was seen by Leeanne Deed AGNP-C in Collaboration with Dr Lyndon Code as a part of collaborative care agreement     Lubertha Basque. Seiji Wiswell AGNP-C Internal medicine

## 2020-04-13 ENCOUNTER — Encounter: Payer: Self-pay | Admitting: Hospice and Palliative Medicine

## 2020-04-17 ENCOUNTER — Other Ambulatory Visit: Payer: Self-pay

## 2020-04-17 ENCOUNTER — Telehealth (INDEPENDENT_AMBULATORY_CARE_PROVIDER_SITE_OTHER): Payer: Self-pay | Admitting: Gastroenterology

## 2020-04-17 DIAGNOSIS — Z1211 Encounter for screening for malignant neoplasm of colon: Secondary | ICD-10-CM

## 2020-04-17 MED ORDER — NA SULFATE-K SULFATE-MG SULF 17.5-3.13-1.6 GM/177ML PO SOLN: 1.0000 | Freq: Once | ORAL | 0 refills | Status: AC

## 2020-04-17 NOTE — Progress Notes (Signed)
Gastroenterology Pre-Procedure Review  Request Date: 04/26/20 Requesting Physician: Dr. Allen Norris  PATIENT REVIEW QUESTIONS: The patient responded to the following health history questions as indicated:    1. Are you having any GI issues? No 2. Do you have a personal history of Polyps? No 3. Do you have a family history of Colon Cancer or Polyps? Mom colon polyps, maternal aunt colon cancer 4. Diabetes Mellitus? No 5. Joint replacements in the past 12 months?No 6. Major health problems in the past 3 months?No 7. Any artificial heart valves, MVP, or defibrillator?No    MEDICATIONS & ALLERGIES:    Patient reports the following regarding taking any anticoagulation/antiplatelet therapy:   Plavix, Coumadin, Eliquis, Xarelto, Lovenox, Pradaxa, Brilinta, or Effient?No Aspirin? No  Patient confirms/reports the following medications:  Current Outpatient Medications  Medication Sig Dispense Refill  . Na Sulfate-K Sulfate-Mg Sulf 17.5-3.13-1.6 GM/177ML SOLN Take 1 kit by mouth once for 1 dose. 354 mL 0   No current facility-administered medications for this visit.    Patient confirms/reports the following allergies:  No Known Allergies  Orders Placed This Encounter  Procedures  . Procedural/ Surgical Case Request: COLONOSCOPY WITH PROPOFOL    Standing Status:   Standing    Number of Occurrences:   1    Order Specific Question:   Pre-op diagnosis    Answer:   screening colonoscopy    Order Specific Question:   CPT Code    Answer:   41962    AUTHORIZATION INFORMATION Primary Insurance: 1D#: Group #:  Secondary Insurance: 1D#: Group #:  SCHEDULE INFORMATION: Date: 04/26/20 Time: Location:[facility]

## 2020-04-18 ENCOUNTER — Encounter: Payer: Self-pay | Admitting: Gastroenterology

## 2020-04-24 ENCOUNTER — Other Ambulatory Visit
Admission: RE | Admit: 2020-04-24 | Discharge: 2020-04-24 | Disposition: A | Discharge status: Home / Self Care | Payer: Managed Care, Other (non HMO) | Source: Ambulatory Visit | Attending: Gastroenterology | Admitting: Gastroenterology

## 2020-04-24 ENCOUNTER — Other Ambulatory Visit: Payer: Self-pay

## 2020-04-24 DIAGNOSIS — Z20822 Contact with and (suspected) exposure to covid-19: Secondary | ICD-10-CM | POA: Insufficient documentation

## 2020-04-24 DIAGNOSIS — Z01812 Encounter for preprocedural laboratory examination: Secondary | ICD-10-CM | POA: Insufficient documentation

## 2020-04-25 LAB — SARS CORONAVIRUS 2 (TAT 6-24 HRS): SARS Coronavirus 2: NEGATIVE

## 2020-04-25 NOTE — Discharge Instructions (Signed)

## 2020-04-26 ENCOUNTER — Encounter: Payer: Self-pay | Admitting: Gastroenterology

## 2020-04-26 ENCOUNTER — Ambulatory Visit: Payer: Managed Care, Other (non HMO) | Admitting: Anesthesiology

## 2020-04-26 ENCOUNTER — Encounter
Admission: RE | Disposition: A | Discharge status: Home / Self Care | Payer: Self-pay | Source: Home / Self Care | Attending: Gastroenterology

## 2020-04-26 ENCOUNTER — Ambulatory Visit
Admission: RE | Admit: 2020-04-26 | Discharge: 2020-04-26 | Disposition: A | Discharge status: Home / Self Care | Payer: Managed Care, Other (non HMO) | Attending: Gastroenterology | Admitting: Gastroenterology

## 2020-04-26 ENCOUNTER — Other Ambulatory Visit: Payer: Self-pay

## 2020-04-26 DIAGNOSIS — Z1211 Encounter for screening for malignant neoplasm of colon: Secondary | ICD-10-CM

## 2020-04-26 DIAGNOSIS — K640 First degree hemorrhoids: Secondary | ICD-10-CM | POA: Diagnosis not present

## 2020-04-26 DIAGNOSIS — K635 Polyp of colon: Secondary | ICD-10-CM | POA: Diagnosis not present

## 2020-04-26 HISTORY — PX: BIOPSY: SHX5522

## 2020-04-26 HISTORY — PX: COLONOSCOPY WITH PROPOFOL: SHX5780

## 2020-04-26 HISTORY — DX: Cardiac murmur, unspecified: R01.1

## 2020-04-26 SURGERY — COLONOSCOPY WITH PROPOFOL
Anesthesia: General

## 2020-04-26 MED ORDER — PROPOFOL 10 MG/ML IV BOLUS
INTRAVENOUS | Status: DC | PRN
  Administered 2020-04-26: 20 mg via INTRAVENOUS
  Administered 2020-04-26: 50 mg via INTRAVENOUS
  Administered 2020-04-26 (×2): 100 mg via INTRAVENOUS

## 2020-04-26 MED ORDER — LACTATED RINGERS IV SOLN: INTRAVENOUS | Status: DC

## 2020-04-26 MED ORDER — STERILE WATER FOR IRRIGATION IR SOLN: Status: DC | PRN

## 2020-04-26 MED ORDER — LACTATED RINGERS IV SOLN: INTRAVENOUS | Status: DC | PRN

## 2020-04-26 MED ORDER — LIDOCAINE HCL (CARDIAC) PF 100 MG/5ML IV SOSY
PREFILLED_SYRINGE | INTRAVENOUS | Status: DC | PRN
  Administered 2020-04-26: 100 mg via INTRAVENOUS

## 2020-04-26 SURGICAL SUPPLY — 7 items
FORCEPS BIOP RAD 4 LRG CAP 4 (CUTTING FORCEPS) ×3 IMPLANT
GOWN CVR UNV OPN BCK APRN NK (MISCELLANEOUS) ×4 IMPLANT
GOWN ISOL THUMB LOOP REG UNIV (MISCELLANEOUS) ×6
KIT PRC NS LF DISP ENDO (KITS) ×2 IMPLANT
KIT PROCEDURE OLYMPUS (KITS) ×3
MANIFOLD NEPTUNE II (INSTRUMENTS) ×3 IMPLANT
WATER STERILE IRR 250ML POUR (IV SOLUTION) ×3 IMPLANT

## 2020-04-26 NOTE — Anesthesia Preprocedure Evaluation (Addendum)
Anesthesia Evaluation  Patient identified by MRN, date of birth, ID band Patient awake    Reviewed: Allergy & Precautions, NPO status   Airway Mallampati: II  TM Distance: >3 FB     Dental   Pulmonary    breath sounds clear to auscultation       Cardiovascular negative cardio ROS   Rhythm:Regular Rate:Normal     Neuro/Psych    GI/Hepatic GERD  ,  Endo/Other    Renal/GU      Musculoskeletal   Abdominal   Peds  Hematology   Anesthesia Other Findings   Reproductive/Obstetrics                            Anesthesia Physical Anesthesia Plan  ASA: II  Anesthesia Plan: General   Post-op Pain Management:    Induction: Intravenous  PONV Risk Score and Plan: Propofol infusion, TIVA and Treatment may vary due to age or medical condition  Airway Management Planned: Natural Airway and Nasal Cannula  Additional Equipment:   Intra-op Plan:   Post-operative Plan:   Informed Consent: I have reviewed the patients History and Physical, chart, labs and discussed the procedure including the risks, benefits and alternatives for the proposed anesthesia with the patient or authorized representative who has indicated his/her understanding and acceptance.       Plan Discussed with: CRNA  Anesthesia Plan Comments:         Anesthesia Quick Evaluation

## 2020-04-26 NOTE — Anesthesia Procedure Notes (Signed)
Procedure Name: General with mask airway Date/Time: 04/26/2020 11:06 AM Performed by: Jinny Blossom, CRNA Pre-anesthesia Checklist: Patient identified, Emergency Drugs available, Suction available, Timeout performed and Patient being monitored Patient Re-evaluated:Patient Re-evaluated prior to induction Oxygen Delivery Method: Nasal cannula Placement Confirmation: positive ETCO2

## 2020-04-26 NOTE — H&P (Signed)
   Midge Minium, MD [facility] [address] [place], [place] [address] Phone: [phone] Fax : [phone]  Primary Care Physician:  Theotis Burrow, NP Primary Gastroenterologist:  Dr. Servando Snare  Pre-Procedure History & Physical: HPI:  Nina Lowe is a 55 y.o. female is here for a screening colonoscopy.   Past Medical History:  Diagnosis Date  . GERD (gastroesophageal reflux disease)   . Heart murmur   . Herpes     Past Surgical History:  Procedure Laterality Date  . ABDOMINAL HYSTERECTOMY      Prior to Admission medications   Not on File    Allergies as of 04/17/2020  . (No Known Allergies)    Family History  Problem Relation Age of Onset  . Hyperlipidemia Mother   . Healthy Father   . Breast cancer Cousin 41    Social History   Socioeconomic History  . Marital status: Divorced    Spouse name: Not on file  . Number of children: Not on file  . Years of education: Not on file  . Highest education level: Not on file  Occupational History  . Not on file  Tobacco Use  . Smoking status: Never Smoker  . Smokeless tobacco: Never Used  Vaping Use  . Vaping Use: Never used  Substance and Sexual Activity  . Alcohol use: Yes    Comment: occassional  . Drug use: Never  . Sexual activity: Not on file  Other Topics Concern  . Not on file  Social History Narrative  . Not on file   Social Determinants of Health   Financial Resource Strain: Not on file  Food Insecurity: Not on file  Transportation Needs: Not on file  Physical Activity: Not on file  Stress: Not on file  Social Connections: Not on file  Intimate Partner Violence: Not on file    Review of Systems: See HPI, otherwise negative ROS  Physical Exam: BP 120/76   Pulse 63   Temp (!) 97.2 F (36.2 C) (Temporal)   Ht 5\' 7"  (1.702 m)   Wt 78 kg   SpO2 100%   BMI 26.94 kg/m  General:   Alert,  pleasant and cooperative in NAD Head:  Normocephalic and atraumatic. Neck:  Supple;  no masses or thyromegaly. Lungs:  Clear throughout to auscultation.    Heart:  Regular rate and rhythm. Abdomen:  Soft, nontender and nondistended. Normal bowel sounds, without guarding, and without rebound.   Neurologic:  Alert and  oriented x4;  grossly normal neurologically.  Impression/Plan: Nina Lowe is now here to undergo a screening colonoscopy.  Risks, benefits, and alternatives regarding colonoscopy have been reviewed with the patient.  Questions have been answered.  All parties agreeable.

## 2020-04-26 NOTE — Transfer of Care (Signed)
Immediate Anesthesia Transfer of Care Note  Patient: Nina Lowe  Procedure(s) Performed: COLONOSCOPY WITH PROPOFOL (N/A ) BIOPSY  Patient Location: PACU  Anesthesia Type: General  Level of Consciousness: awake, alert  and patient cooperative  Airway and Oxygen Therapy: Patient Spontanous Breathing and Patient connected to supplemental oxygen  Post-op Assessment: Post-op Vital signs reviewed, Patient's Cardiovascular Status Stable, Respiratory Function Stable, Patent Airway and No signs of Nausea or vomiting  Post-op Vital Signs: Reviewed and stable  Complications: No complications documented.

## 2020-04-26 NOTE — Op Note (Signed)
[facility] Gastroenterology Patient Name: Nina Lowe Procedure Date: 04/26/2020 10:54 AM MRN: [id number] Account #: [id number] Date of Birth: 07-Jan-1966 Admit Type: Outpatient Age: 55 Room: [facility] OR ROOM 01 Gender: Female Note Status: Finalized Procedure:             Colonoscopy Indications:           Screening for colorectal malignant neoplasm Providers:             Midge Minium MD, MD Medicines:             Propofol per Anesthesia Complications:         No immediate complications. Procedure:             Pre-Anesthesia Assessment:                        - Prior to the procedure, a History and Physical was                         performed, and patient medications and allergies were                         reviewed. The patient's tolerance of previous                         anesthesia was also reviewed. The risks and benefits                         of the procedure and the sedation options and risks                         were discussed with the patient. All questions were                         answered, and informed consent was obtained. Prior                         Anticoagulants: The patient has taken no previous                         anticoagulant or antiplatelet agents. ASA Grade                         Assessment: II - A patient with mild systemic disease.                         After reviewing the risks and benefits, the patient                         was deemed in satisfactory condition to undergo the                         procedure.                        After obtaining informed consent, the colonoscope was                         passed under direct vision. Throughout the procedure,  the patient's blood pressure, pulse, and oxygen                         saturations were monitored continuously. The                         Colonoscope was introduced through the anus and                         advanced to the the  cecum, identified by appendiceal                         orifice and ileocecal valve. The colonoscopy was                         performed without difficulty. The patient tolerated                         the procedure well. The quality of the bowel                         preparation was fair. Findings:      The perianal and digital rectal examinations were normal.      A 1 mm polyp was found in the transverse colon. The polyp was sessile.       The polyp was removed with a cold biopsy forceps. Resection and       retrieval were complete.      Non-bleeding internal hemorrhoids were found during retroflexion. The       hemorrhoids were Grade I (internal hemorrhoids that do not prolapse). Impression:            - Preparation of the colon was fair.                        - One 1 mm polyp in the transverse colon, removed with                         a cold biopsy forceps. Resected and retrieved.                        - Non-bleeding internal hemorrhoids. Recommendation:        - Discharge patient to home.                        - Resume previous diet.                        - Continue present medications.                        - Await pathology results.                        - Repeat colonoscopy in 5 years if polyp adenoma and                         10 years if hyperplastic Procedure Code(s):     --- Professional ---  16109, Colonoscopy, flexible; with biopsy, single or                         multiple Diagnosis Code(s):     --- Professional ---                        Z12.11, Encounter for screening for malignant neoplasm                         of colon                        K63.5, Polyp of colon CPT copyright 2019 American Medical Association. All rights reserved. The codes documented in this report are preliminary and upon coder review may  be revised to meet current compliance requirements. Midge Minium MD, MD 04/26/2020 11:16:14 AM This report has been  signed electronically. Number of Addenda: 0 Note Initiated On: 04/26/2020 10:54 AM Scope Withdrawal Time: 0 hours 9 minutes 3 seconds  Total Procedure Duration: 0 hours 13 minutes 6 seconds  Estimated Blood Loss:  Estimated blood loss: none.      [facility]

## 2020-04-26 NOTE — Anesthesia Postprocedure Evaluation (Signed)
Anesthesia Post Note  Patient: Community education officer  Procedure(s) Performed: COLONOSCOPY WITH PROPOFOL (N/A ) BIOPSY     Patient location during evaluation: PACU Anesthesia Type: General Level of consciousness: awake Pain management: pain level controlled Vital Signs Assessment: post-procedure vital signs reviewed and stable Respiratory status: respiratory function stable Cardiovascular status: stable Postop Assessment: no signs of nausea or vomiting Anesthetic complications: no   No complications documented.  Jola Babinski

## 2020-04-29 ENCOUNTER — Encounter: Payer: Self-pay | Admitting: Gastroenterology

## 2020-04-29 LAB — SURGICAL PATHOLOGY

## 2020-05-15 ENCOUNTER — Other Ambulatory Visit: Payer: Self-pay | Admitting: Hospice and Palliative Medicine

## 2020-05-21 ENCOUNTER — Other Ambulatory Visit: Payer: Self-pay

## 2020-05-21 ENCOUNTER — Ambulatory Visit: Payer: Managed Care, Other (non HMO) | Admitting: Internal Medicine

## 2020-05-21 ENCOUNTER — Encounter: Payer: Self-pay | Admitting: Internal Medicine

## 2020-05-21 VITALS — BP 124/80 | HR 69 | Temp 97.9°F | Resp 16 | Ht 67.0 in | Wt 181.4 lb

## 2020-05-21 DIAGNOSIS — G8929 Other chronic pain: Secondary | ICD-10-CM

## 2020-05-21 DIAGNOSIS — R1031 Right lower quadrant pain: Secondary | ICD-10-CM

## 2020-05-21 DIAGNOSIS — K219 Gastro-esophageal reflux disease without esophagitis: Secondary | ICD-10-CM

## 2020-05-21 DIAGNOSIS — K5909 Other constipation: Secondary | ICD-10-CM

## 2020-05-21 DIAGNOSIS — R059 Cough, unspecified: Secondary | ICD-10-CM

## 2020-05-21 DIAGNOSIS — R1032 Left lower quadrant pain: Secondary | ICD-10-CM

## 2020-05-21 NOTE — Progress Notes (Signed)
[facility] [address] [place], [place] [address]  Internal MEDICINE  Office Visit Note  Patient Name: Nina Lowe  [id number]  [id number]  Date of Service: 05/28/2020  Chief Complaint  Patient presents with  . Abdominal Pain    Off and on for 2 years now is consistent twice a month with diarrhea, cramping and nausea.  Feels like contractions   . Gastroesophageal Reflux    Has issues with vomiting with certain things she eats    HPI  Pt is here for acute and sick visit, abdominal pain and cramping for last 2 years. Lower part of the abdomen hurts more. Constipation for a long time. Has both nausea and vomiting. Pt thinks she has heartburn. Instant vomiting at times.  She did try Prilosec prn with no relief. She did have a normal colonoscopy but to EGD on file  Sleeps well, cough at night.    Current Medication: Outpatient Encounter Medications as of 05/21/2020  Medication Sig  . traMADol (ULTRAM) 50 MG tablet Take by mouth every 8 (eight) hours as needed. Take 1 tablet every 8 hours as needed or up to 5 days (Patient not taking: Reported on 05/21/2020)   No facility-administered encounter medications on file as of 05/21/2020.    Surgical History: Past Surgical History:  Procedure Laterality Date  . ABDOMINAL HYSTERECTOMY    . BIOPSY  04/26/2020   Procedure: BIOPSY;  Surgeon: Midge Minium, MD;  Location: [facility];  Service: Endoscopy;;  . COLONOSCOPY WITH PROPOFOL N/A 04/26/2020   Procedure: COLONOSCOPY WITH PROPOFOL;  Surgeon: Midge Minium, MD;  Location: [facility];  Service: Endoscopy;  Laterality: N/A;    Medical History: Past Medical History:  Diagnosis Date  . GERD (gastroesophageal reflux disease)   . Heart murmur   . Herpes     Family History: Family History  Problem Relation Age of Onset  . Hyperlipidemia Mother   . Healthy Father   . Breast cancer Cousin 61    Social History   Socioeconomic History  .  Marital status: Divorced    Spouse name: Not on file  . Number of children: Not on file  . Years of education: Not on file  . Highest education level: Not on file  Occupational History  . Not on file  Tobacco Use  . Smoking status: Never Smoker  . Smokeless tobacco: Never Used  Vaping Use  . Vaping Use: Never used  Substance and Sexual Activity  . Alcohol use: Yes    Comment: occassional  . Drug use: Never  . Sexual activity: Not on file  Other Topics Concern  . Not on file  Social History Narrative  . Not on file   Social Determinants of Health   Financial Resource Strain: Not on file  Food Insecurity: Not on file  Transportation Needs: Not on file  Physical Activity: Not on file  Stress: Not on file  Social Connections: Not on file  Intimate Partner Violence: Not on file      Review of Systems  Constitutional: Negative for chills, diaphoresis and fatigue.  HENT: Negative for ear pain, postnasal drip and sinus pressure.   Eyes: Negative for photophobia, discharge, redness, itching and visual disturbance.  Respiratory: Positive for cough. Negative for shortness of breath and wheezing.   Cardiovascular: Negative for chest pain, palpitations and leg swelling.  Gastrointestinal: Positive for constipation. Negative for abdominal pain, diarrhea, nausea and vomiting.       Heartburn  Genitourinary:  Negative for dysuria and flank pain.  Musculoskeletal: Negative for arthralgias, back pain, gait problem and neck pain.  Skin: Negative for color change.  Allergic/Immunologic: Negative for environmental allergies and food allergies.  Neurological: Negative for dizziness and headaches.  Hematological: Does not bruise/bleed easily.  Psychiatric/Behavioral: Negative for agitation, behavioral problems (depression) and hallucinations.    Vital Signs: BP 124/80   Pulse 69   Temp 97.9 F (36.6 C)   Resp 16   Ht 5\' 7"  (1.702 m)   Wt 181 lb 6.4 oz (82.3 kg)   SpO2 99%   BMI  28.41 kg/m    Physical Exam Constitutional:      General: She is not in acute distress.    Appearance: She is well-developed. She is not diaphoretic.  HENT:     Head: Normocephalic and atraumatic.     Mouth/Throat:     Pharynx: No oropharyngeal exudate.  Eyes:     Pupils: Pupils are equal, round, and reactive to light.  Neck:     Thyroid: No thyromegaly.     Vascular: No JVD.     Trachea: No tracheal deviation.  Cardiovascular:     Rate and Rhythm: Normal rate and regular rhythm.     Heart sounds: Normal heart sounds. No murmur heard. No friction rub. No gallop.   Pulmonary:     Effort: Pulmonary effort is normal. No respiratory distress.     Breath sounds: No wheezing or rales.  Chest:     Chest wall: No tenderness.  Abdominal:     General: Bowel sounds are normal.     Palpations: Abdomen is soft.  Musculoskeletal:        General: Normal range of motion.     Cervical back: Normal range of motion and neck supple.  Lymphadenopathy:     Cervical: No cervical adenopathy.  Skin:    General: Skin is warm and dry.  Neurological:     Mental Status: She is alert and oriented to person, place, and time.     Cranial Nerves: No cranial nerve deficit.  Psychiatric:        Behavior: Behavior normal.        Thought Content: Thought content normal.        Judgment: Judgment normal.        Assessment/Plan: 1. GERD without esophagitis Pt is instructed to take OTC Prilosec regularly and every day, does not want to have a prescription  - DG UGI W DOUBLE CM (HD BA); Future - H. pylori antigen, stool - H Pylori, IGM, IGG, IGA AB - CA 125 - CBC with Differential/Platelet - B12 and Folate Panel - Fe+TIBC+Fer  2. Cough Mostly at night, can be related to reflux, will continue to monitor   3. Chronic bilateral lower abdominal pain Normal Colonoscopy and pelvic U/S, ca 125 is ordered, might need abdominal U/S to look for any gallbladder vs pancreatic vs liver problems   4.  Chronic constipation A high fiber diet with plenty of fluids (up to 8 glasses of water daily) is suggested to relieve these symptoms.  Metamucil, 1 tablespoon once or twice daily can be used to keep bowels regular if needed.  General Counseling: Nina Lowe verbalizes understanding of the findings of todays visit and agrees with plan of treatment. I have discussed any further diagnostic evaluation that may be needed or ordered today. We also reviewed her medications today. she has been encouraged to call the office with any questions or concerns that should arise  related to todays visit.    Orders Placed This Encounter  Procedures  . DG UGI W DOUBLE CM (HD BA)  . H. pylori antigen, stool  . H Pylori, IGM, IGG, IGA AB  . CA 125  . CBC with Differential/Platelet  . B12 and Folate Panel  . Fe+TIBC+Fer    No orders of the defined types were placed in this encounter.   Total time spent:35 Minutes Time spent includes review of chart, medications, test results, and follow up plan with the patient.   [place] Controlled Substance Database was reviewed by me.   Dr Lyndon Code Internal medicine

## 2020-05-23 LAB — CBC WITH DIFFERENTIAL/PLATELET
Basophils Absolute: 0 10*3/uL (ref 0.0–0.2)
Basos: 0 %
EOS (ABSOLUTE): 0.1 10*3/uL (ref 0.0–0.4)
Eos: 2 %
Hematocrit: 38.5 % (ref 34.0–46.6)
Hemoglobin: 12.5 g/dL (ref 11.1–15.9)
Immature Grans (Abs): 0 10*3/uL (ref 0.0–0.1)
Immature Granulocytes: 0 %
Lymphocytes Absolute: 2.5 10*3/uL (ref 0.7–3.1)
Lymphs: 53 %
MCH: 29 pg (ref 26.6–33.0)
MCHC: 32.5 g/dL (ref 31.5–35.7)
MCV: 89 fL (ref 79–97)
Monocytes Absolute: 0.3 10*3/uL (ref 0.1–0.9)
Monocytes: 6 %
Neutrophils Absolute: 1.8 10*3/uL (ref 1.4–7.0)
Neutrophils: 39 %
Platelets: 272 10*3/uL (ref 150–450)
RBC: 4.31 x10E6/uL (ref 3.77–5.28)
RDW: 12.2 % (ref 11.7–15.4)
WBC: 4.7 10*3/uL (ref 3.4–10.8)

## 2020-05-23 LAB — CA 125: Cancer Antigen (CA) 125: 7 U/mL (ref 0.0–38.1)

## 2020-05-23 LAB — IRON,TIBC AND FERRITIN PANEL
Ferritin: 93 ng/mL (ref 15–150)
Iron Saturation: 39 % (ref 15–55)
Iron: 104 ug/dL (ref 27–159)
Total Iron Binding Capacity: 270 ug/dL (ref 250–450)
UIBC: 166 ug/dL (ref 131–425)

## 2020-05-23 LAB — H PYLORI, IGM, IGG, IGA AB
H pylori, IgM Abs: <9 units (ref 0.0–8.9)
H. pylori, IgA Abs: <9 units (ref 0.0–8.9)
H. pylori, IgG AbS: 0.35 Index Value (ref 0.00–0.79)

## 2020-05-23 LAB — B12 AND FOLATE PANEL
Folate: 8.2 ng/mL (ref >3.0)
Vitamin B-12: 280 pg/mL (ref 232–1245)

## 2020-05-29 ENCOUNTER — Other Ambulatory Visit: Payer: Self-pay

## 2020-05-29 ENCOUNTER — Ambulatory Visit
Admission: RE | Admit: 2020-05-29 | Discharge: 2020-05-29 | Disposition: A | Discharge status: Home / Self Care | Payer: Managed Care, Other (non HMO) | Source: Ambulatory Visit | Attending: Internal Medicine | Admitting: Internal Medicine

## 2020-05-29 DIAGNOSIS — K219 Gastro-esophageal reflux disease without esophagitis: Secondary | ICD-10-CM

## 2020-05-30 ENCOUNTER — Telehealth: Payer: Self-pay

## 2020-06-06 NOTE — Telephone Encounter (Signed)
Sent note to [facility] 

## 2020-06-11 ENCOUNTER — Ambulatory Visit: Payer: Managed Care, Other (non HMO) | Admitting: Internal Medicine

## 2020-11-06 ENCOUNTER — Telehealth: Payer: Self-pay

## 2020-11-06 NOTE — Telephone Encounter (Signed)
Spoke with the pt regarding scheduling an EGD. Pt stated that since that scan, she has been feeling okay. She would like to wait on the EGD. Advised pt if she starts to have any heartburn symptoms or issues swallowing, she is to contact me and we can schedule an EGD or office appt.

## 2020-11-06 NOTE — Telephone Encounter (Signed)
-----   Message from Midge Minium, MD sent at 10/20/2020  7:29 AM EDT ----- Hi,  I agree that she needs an EGD. I have included by CMA so that we can call her and let her know she needs to be set up for an EGD.  Thanks. ----- Message ----- From: Lyndon Code, MD Sent: 10/14/2020   7:31 AM EDT To: Lyndon Code, MD, Midge Minium, MD  Good morning  Can you review her UGI, she has been a no show in out office, I think she needs a EGD, mutual pt.

## 2020-12-10 ENCOUNTER — Other Ambulatory Visit: Payer: Managed Care, Other (non HMO) | Admitting: Nurse Practitioner

## 2020-12-16 ENCOUNTER — Other Ambulatory Visit: Payer: Managed Care, Other (non HMO) | Admitting: Nurse Practitioner

## 2020-12-31 HISTORY — PX: OTHER SURGICAL HISTORY: SHX169

## 2021-02-10 ENCOUNTER — Ambulatory Visit (INDEPENDENT_AMBULATORY_CARE_PROVIDER_SITE_OTHER): Payer: Managed Care, Other (non HMO) | Admitting: Nurse Practitioner

## 2021-02-10 ENCOUNTER — Encounter: Payer: Self-pay | Admitting: Nurse Practitioner

## 2021-02-10 ENCOUNTER — Other Ambulatory Visit: Payer: Self-pay

## 2021-02-10 VITALS — BP 115/72 | HR 59 | Temp 98.3°F | Resp 16 | Ht 66.5 in | Wt 178.0 lb

## 2021-02-10 DIAGNOSIS — L680 Hirsutism: Secondary | ICD-10-CM

## 2021-02-10 DIAGNOSIS — Z0001 Encounter for general adult medical examination with abnormal findings: Secondary | ICD-10-CM

## 2021-02-10 DIAGNOSIS — Z1231 Encounter for screening mammogram for malignant neoplasm of breast: Secondary | ICD-10-CM

## 2021-02-10 DIAGNOSIS — E559 Vitamin D deficiency, unspecified: Secondary | ICD-10-CM | POA: Diagnosis not present

## 2021-02-10 DIAGNOSIS — R300 Dysuria: Secondary | ICD-10-CM

## 2021-02-10 DIAGNOSIS — K219 Gastro-esophageal reflux disease without esophagitis: Secondary | ICD-10-CM | POA: Diagnosis not present

## 2021-02-10 NOTE — Progress Notes (Signed)
[facility] [address] [place], [place] [address]  Internal MEDICINE  Office Visit Note  Patient Name: Nina Lowe  11/09/1965  [id number]  Date of Service: 02/10/2021  Chief Complaint  Patient presents with   Annual Exam   Gastroesophageal Reflux    HPI Nina Lowe presents for an annual well visit and physical exam.  She is a well-appearing 55-year-old female.  Her chronic medical problems are only significant for gastroesophageal reflux.  She has lost 4 pounds since last year.  She is due for routine screening mammogram in January and routine labs and she also wants to add hormone labs and other significant labs to check for causes of chronic fatigue.  She had a routine colonoscopy in February of this year and was normal.  She is not due for any other preventive screenings. Patient had an abnormal Pap in October last year and was sent for colposcopy with Dr. Schuman but on her colposcopy it was found that she had a hysterectomy in which her cervix was also removed and she was informed that she does not need to have any further Pap smears.   Current Medication: Outpatient Encounter Medications as of 02/10/2021  Medication Sig   [DISCONTINUED] traMADol (ULTRAM) 50 MG tablet Take by mouth every 8 (eight) hours as needed. Take 1 tablet every 8 hours as needed or up to 5 days (Patient not taking: Reported on 05/21/2020)   No facility-administered encounter medications on file as of 02/10/2021.    Surgical History: Past Surgical History:  Procedure Laterality Date   ABDOMINAL HYSTERECTOMY     BIOPSY  04/26/2020   Procedure: BIOPSY;  Surgeon: Wohl, Darren, MD;  Location: [facility];  Service: Endoscopy;;   COLONOSCOPY WITH PROPOFOL N/A 04/26/2020   Procedure: COLONOSCOPY WITH PROPOFOL;  Surgeon: Wohl, Darren, MD;  Location: [facility];  Service: Endoscopy;  Laterality: N/A;   Percutaneous Nephrolithotomy  12/31/2020    Medical History: Past  Medical History:  Diagnosis Date   GERD (gastroesophageal reflux disease)    Heart murmur    Herpes    Kidney stones     Family History: Family History  Problem Relation Age of Onset   Hyperlipidemia Mother    Healthy Father    Breast cancer Cousin 51    Social History   Socioeconomic History   Marital status: Divorced    Spouse name: Not on file   Number of children: Not on file   Years of education: Not on file   Highest education level: Not on file  Occupational History   Not on file  Tobacco Use   Smoking status: Never   Smokeless tobacco: Never  Vaping Use   Vaping Use: Never used  Substance and Sexual Activity   Alcohol use: Yes    Comment: occassional   Drug use: Never   Sexual activity: Not on file  Other Topics Concern   Not on file  Social History Narrative   Not on file   Social Determinants of Health   Financial Resource Strain: Not on file  Food Insecurity: Not on file  Transportation Needs: Not on file  Physical Activity: Not on file  Stress: Not on file  Social Connections: Not on file  Intimate Partner Violence: Not on file      Review of Systems  Constitutional:  Negative for activity change, appetite change, chills, fatigue, fever and unexpected weight change.  HENT: Negative.  Negative for congestion, ear pain, rhinorrhea, sore throat and   trouble swallowing.   Eyes: Negative.   Respiratory: Negative.  Negative for cough, chest tightness, shortness of breath and wheezing.   Cardiovascular: Negative.  Negative for chest pain.  Gastrointestinal: Negative.  Negative for abdominal pain, blood in stool, constipation, diarrhea, nausea and vomiting.  Endocrine: Negative.   Genitourinary: Negative.  Negative for difficulty urinating, dysuria, frequency, hematuria and urgency.  Musculoskeletal: Negative.  Negative for arthralgias, back pain, joint swelling, myalgias and neck pain.  Skin: Negative.  Negative for rash and wound.   Allergic/Immunologic: Negative.  Negative for immunocompromised state.  Neurological: Negative.  Negative for dizziness, seizures, numbness and headaches.  Hematological: Negative.   Psychiatric/Behavioral: Negative.  Negative for behavioral problems, self-injury and suicidal ideas. The patient is not nervous/anxious.    Vital Signs: BP 115/72   Pulse (!) 59   Temp 98.3 F (36.8 C)   Resp 16   Ht 5' 6.5" (1.689 m)   Wt 178 lb (80.7 kg)   SpO2 98%   BMI 28.30 kg/m    Physical Exam Vitals reviewed.  Constitutional:      General: She is awake. She is not in acute distress.    Appearance: Normal appearance. She is well-developed, well-groomed and overweight. She is not ill-appearing or diaphoretic.  HENT:     Head: Normocephalic and atraumatic.     Right Ear: Tympanic membrane, ear canal and external ear normal.     Left Ear: Tympanic membrane, ear canal and external ear normal.     Nose: Nose normal. No congestion or rhinorrhea.     Mouth/Throat:     Lips: Pink.     Mouth: Mucous membranes are moist.     Pharynx: Oropharynx is clear. Uvula midline. No oropharyngeal exudate or posterior oropharyngeal erythema.  Eyes:     General: Lids are normal. Vision grossly intact. Gaze aligned appropriately. No scleral icterus.       Right eye: No discharge.        Left eye: No discharge.     Extraocular Movements: Extraocular movements intact.     Conjunctiva/sclera: Conjunctivae normal.     Pupils: Pupils are equal, round, and reactive to light.     Funduscopic exam:    Right eye: Red reflex present.        Left eye: Red reflex present. Neck:     Thyroid: No thyromegaly.     Vascular: No JVD.     Trachea: Trachea and phonation normal. No tracheal deviation.  Cardiovascular:     Rate and Rhythm: Normal rate and regular rhythm.     Pulses: Normal pulses.     Heart sounds: Normal heart sounds, S1 normal and S2 normal. No murmur heard.   No friction rub. No gallop.  Pulmonary:      Effort: Pulmonary effort is normal. No accessory muscle usage or respiratory distress.     Breath sounds: Normal breath sounds and air entry. No stridor. No wheezing or rales.  Chest:     Chest wall: No tenderness.     Comments: Declined clinical breast exam, gets annual mammograms Abdominal:     General: Bowel sounds are normal. There is no distension.     Palpations: Abdomen is soft. There is no shifting dullness, fluid wave, mass or pulsatile mass.     Tenderness: There is no abdominal tenderness. There is no guarding or rebound.  Musculoskeletal:        General: No tenderness or deformity. Normal range of motion.     Cervical  back: Normal range of motion and neck supple.  Lymphadenopathy:     Cervical: No cervical adenopathy.  Skin:    General: Skin is warm and dry.     Capillary Refill: Capillary refill takes less than 2 seconds.     Coloration: Skin is not pale.     Findings: No erythema or rash.  Neurological:     Mental Status: She is alert and oriented to person, place, and time.     Cranial Nerves: No cranial nerve deficit.     Motor: No abnormal muscle tone.     Coordination: Coordination normal.     Gait: Gait normal.     Deep Tendon Reflexes: Reflexes are normal and symmetric.  Psychiatric:        Mood and Affect: Mood and affect normal.        Behavior: Behavior normal. Behavior is cooperative.        Thought Content: Thought content normal.        Judgment: Judgment normal.       Assessment/Plan: 1. Encounter for general adult medical examination with abnormal findings Age-appropriate preventive screenings and vaccinations discussed, annual physical exam completed. Routine labs for health maintenance ordered, see below. PHM updated.   2. Hirsutism Patient has had increased hair growth and is concerned that her hormones may be off, several labs ordered to rule out other possible causes.  Patient is in postsurgical menopause. - FSH/LH - Estradiol - Lipid  Profile - CBC with Differential/Platelet - CMP14+EGFR - Vitamin D (25 hydroxy) - TSH + free T4 - Prolactin - Testosterone,Free and Total  3. GERD without esophagitis Stable, patient avoids triggers and may take over-the-counter medication if needed.  4. Vitamin D deficiency Routine lab ordered  5. Dysuria Routine urinalysis done - UA/M w/rflx Culture, Routine  6. Encounter for screening mammogram for malignant neoplasm of breast Routine mammogram ordered - MM 3D SCREEN BREAST BILATERAL; Future     General Counseling: Vi verbalizes understanding of the findings of todays visit and agrees with plan of treatment. I have discussed any further diagnostic evaluation that may be needed or ordered today. We also reviewed her medications today. she has been encouraged to call the office with any questions or concerns that should arise related to todays visit.    Orders Placed This Encounter  Procedures   Microscopic Examination   Urine Culture, Reflex   MM 3D SCREEN BREAST BILATERAL   FSH/LH   Estradiol   Lipid Profile   CBC with Differential/Platelet   CMP14+EGFR   Vitamin D (25 hydroxy)   TSH + free T4   Prolactin   Testosterone,Free and Total   UA/M w/rflx Culture, Routine    No orders of the defined types were placed in this encounter.   Return in about 1 year (around 02/10/2022) for CPE, Alyssa PCP.   Total time spent:30 Minutes Time spent includes review of chart, medications, test results, and follow up plan with the patient.   [place] Controlled Substance Database was reviewed by me.  This patient was seen by Alyssa Abernathy, FNP-C in collaboration with Dr. Fozia Khan as a part of collaborative care agreement.  Alyssa R. Abernathy, MSN, FNP-C Internal medicine  

## 2021-02-15 LAB — URINE CULTURE, REFLEX

## 2021-02-15 LAB — MICROSCOPIC EXAMINATION
Epithelial Cells (non renal): >10 /hpf — AB (ref 0–10)
WBC, UA: >30 /hpf — AB (ref 0–5)

## 2021-02-15 LAB — UA/M W/RFLX CULTURE, ROUTINE
Bilirubin, UA: NEGATIVE
Glucose, UA: NEGATIVE
Nitrite, UA: NEGATIVE
Specific Gravity, UA: 1.024 (ref 1.005–1.030)
Urobilinogen, Ur: 1 mg/dL (ref 0.2–1.0)
pH, UA: 5 (ref 5.0–7.5)

## 2021-03-05 ENCOUNTER — Encounter: Payer: Self-pay | Admitting: Nurse Practitioner

## 2021-03-12 LAB — CBC WITH DIFFERENTIAL/PLATELET
Basophils Absolute: 0 10*3/uL (ref 0.0–0.2)
Basos: 1 %
EOS (ABSOLUTE): 0.1 10*3/uL (ref 0.0–0.4)
Eos: 2 %
Hematocrit: 36 % (ref 34.0–46.6)
Hemoglobin: 12 g/dL (ref 11.1–15.9)
Immature Grans (Abs): 0 10*3/uL (ref 0.0–0.1)
Immature Granulocytes: 0 %
Lymphocytes Absolute: 2 10*3/uL (ref 0.7–3.1)
Lymphs: 50 %
MCH: 29.1 pg (ref 26.6–33.0)
MCHC: 33.3 g/dL (ref 31.5–35.7)
MCV: 87 fL (ref 79–97)
Monocytes Absolute: 0.3 10*3/uL (ref 0.1–0.9)
Monocytes: 7 %
Neutrophils Absolute: 1.6 10*3/uL (ref 1.4–7.0)
Neutrophils: 40 %
Platelets: 278 10*3/uL (ref 150–450)
RBC: 4.12 x10E6/uL (ref 3.77–5.28)
RDW: 12.7 % (ref 11.7–15.4)
WBC: 4 10*3/uL (ref 3.4–10.8)

## 2021-03-12 LAB — LIPID PANEL
Chol/HDL Ratio: 3.1 ratio (ref 0.0–4.4)
Cholesterol, Total: 180 mg/dL (ref 100–199)
HDL: 58 mg/dL (ref >39)
LDL Chol Calc (NIH): 114 mg/dL — ABNORMAL HIGH (ref 0–99)
Triglycerides: 40 mg/dL (ref 0–149)
VLDL Cholesterol Cal: 8 mg/dL (ref 5–40)

## 2021-03-12 LAB — TSH+FREE T4
Free T4: 1.34 ng/dL (ref 0.82–1.77)
TSH: 1.47 u[IU]/mL (ref 0.450–4.500)

## 2021-03-12 LAB — CMP14+EGFR
ALT: 7 IU/L (ref 0–32)
AST: 14 IU/L (ref 0–40)
Albumin/Globulin Ratio: 1.6 (ref 1.2–2.2)
Albumin: 4.5 g/dL (ref 3.8–4.9)
Alkaline Phosphatase: 127 IU/L — ABNORMAL HIGH (ref 44–121)
BUN/Creatinine Ratio: 12 (ref 9–23)
BUN: 10 mg/dL (ref 6–24)
Bilirubin Total: 0.3 mg/dL (ref 0.0–1.2)
CO2: 22 mmol/L (ref 20–29)
Calcium: 9.7 mg/dL (ref 8.7–10.2)
Chloride: 106 mmol/L (ref 96–106)
Creatinine, Ser: 0.84 mg/dL (ref 0.57–1.00)
Globulin, Total: 2.9 g/dL (ref 1.5–4.5)
Glucose: 85 mg/dL (ref 70–99)
Potassium: 4.2 mmol/L (ref 3.5–5.2)
Sodium: 143 mmol/L (ref 134–144)
Total Protein: 7.4 g/dL (ref 6.0–8.5)
eGFR: 83 mL/min/{1.73_m2} (ref >59)

## 2021-03-12 LAB — PROLACTIN: Prolactin: 10.1 ng/mL (ref 4.8–23.3)

## 2021-03-12 LAB — TESTOSTERONE,FREE AND TOTAL
Testosterone, Free: 0.8 pg/mL (ref 0.0–4.2)
Testosterone: 25 ng/dL (ref 4–50)

## 2021-03-12 LAB — ESTRADIOL: Estradiol: 7 pg/mL

## 2021-03-12 LAB — FSH/LH
FSH: 101 m[IU]/mL
LH: 64.5 m[IU]/mL

## 2021-03-12 LAB — VITAMIN D 25 HYDROXY (VIT D DEFICIENCY, FRACTURES): Vit D, 25-Hydroxy: 18.2 ng/mL — ABNORMAL LOW (ref 30.0–100.0)

## 2021-03-19 ENCOUNTER — Other Ambulatory Visit: Payer: Self-pay

## 2021-03-19 ENCOUNTER — Telehealth: Payer: Self-pay

## 2021-03-19 MED ORDER — ERGOCALCIFEROL 1.25 MG (50000 UT) PO CAPS: 50000.0000 [IU] | ORAL_CAPSULE | ORAL | 5 refills | Status: DC

## 2021-03-19 NOTE — Progress Notes (Signed)
Spoke to pt, informed her of results and sent vitamin D to pharmacy

## 2021-03-19 NOTE — Telephone Encounter (Signed)
-----   Message from Sallyanne Kuster, NP sent at 03/19/2021  4:51 AM EST ----- Please call patient and let her know her lab results: FSH and LH are elevated which confirms that she is postmenopausal.  Estradiol level is normal -cholesterol levels are normal except for a slightly elevated LDL at 114 but this has improved from her previous lab -CBC is normal -metabolic panel is normal except for a persistently elevated alkaline phosphatase. But her other liver enzymes are normal. This is not clinically significant at this time.  -vitamin D level is significantly low, she will need a prescription for vitamin D 50,000 unit capsule, take 1 capsule by mouth weekly.  Thyroid levels are normal Prolactin and testosterone levels are normal

## 2021-03-19 NOTE — Progress Notes (Signed)
Please call patient and let her know her lab results: FSH and LH are elevated which confirms that she is postmenopausal.  Estradiol level is normal -cholesterol levels are normal except for a slightly elevated LDL at 114 but this has improved from her previous lab -CBC is normal -metabolic panel is normal except for a persistently elevated alkaline phosphatase. But her other liver enzymes are normal. This is not clinically significant at this time.  -vitamin D level is significantly low, she will need a prescription for vitamin D 50,000 unit capsule, take 1 capsule by mouth weekly.  Thyroid levels are normal Prolactin and testosterone levels are normal

## 2021-03-19 NOTE — Telephone Encounter (Signed)
LMOM for pt to call back for results, also need the pharmacy to send Vitamin D

## 2021-05-15 ENCOUNTER — Ambulatory Visit
Admission: RE | Admit: 2021-05-15 | Discharge: 2021-05-15 | Disposition: A | Discharge status: Home / Self Care | Payer: Managed Care, Other (non HMO) | Source: Ambulatory Visit | Attending: Nurse Practitioner | Admitting: Nurse Practitioner

## 2021-05-15 ENCOUNTER — Other Ambulatory Visit: Payer: Self-pay

## 2021-05-15 DIAGNOSIS — Z1231 Encounter for screening mammogram for malignant neoplasm of breast: Secondary | ICD-10-CM

## 2021-12-30 IMAGING — RF DG UGI W/ HIGH DENSITY W/O KUB
14 of 16 series · 14 of 16 positions shown · non-contrast
Comparison: No prior.

CLINICAL DATA: Heartburn. Vomiting after eating. Difficulty
swallowing.

EXAM:
UPPER GI SERIES WITHOUT KUB
TECHNIQUE: Routine upper GI series was performed with high density and thin
barium.
FLUOROSCOPY TIME:  Fluoroscopy Time:  3 minutes and 18 seconds
Radiation Exposure Index (if provided by the fluoroscopic device):
65.2 mGy

[Series 1: fluoro_barium singleshot_bw · 0.17mm/px · 1 of 1 slices shown (1 of 14)]
[im 1/1]
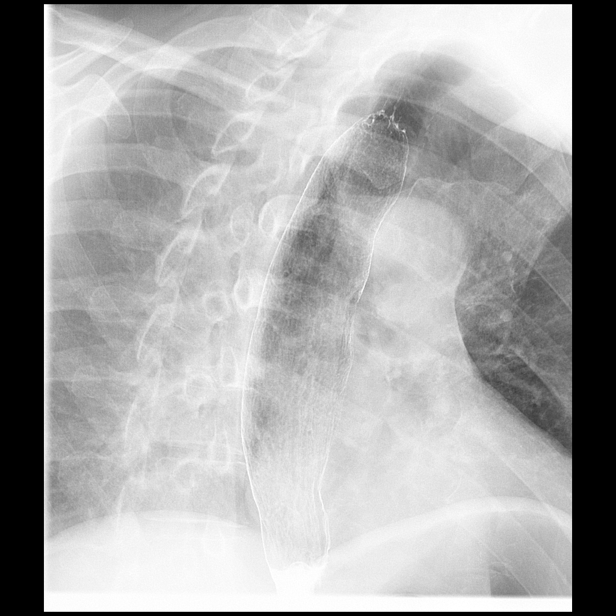

[Series 2: fluoro_barium singleshot_bw · 0.17mm/px · 1 of 1 slices shown (2 of 14)]
[im 1/1]
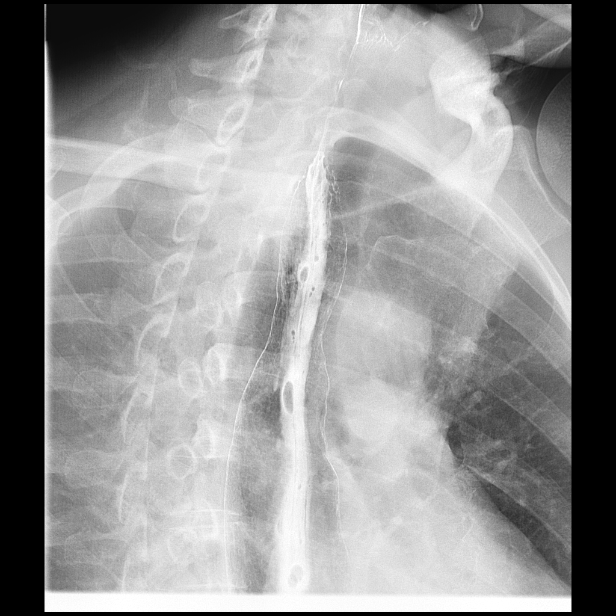

[Series 3: fluoro_barium singleshot_bw · 0.17mm/px · 1 of 1 slices shown (3 of 14)]
[im 1/1]
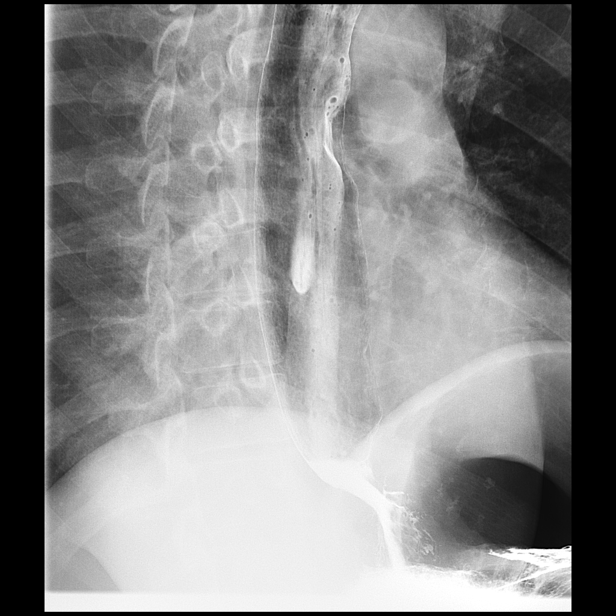

[Series 5: fluoro_barium singleshot_bw · 0.17mm/px · 1 of 1 slices shown (4 of 14)]
[im 1/1]
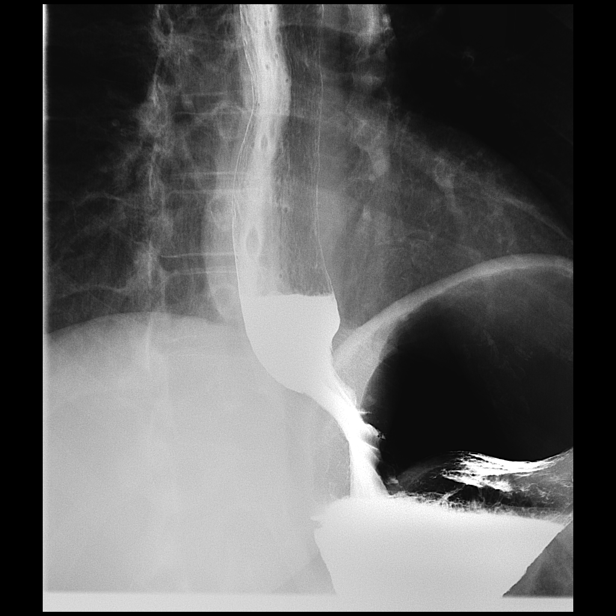

[Series 6: fluoro_barium singleshot_bw · 0.18mm/px · 1 of 1 slices shown (5 of 14)]
[im 1/1]
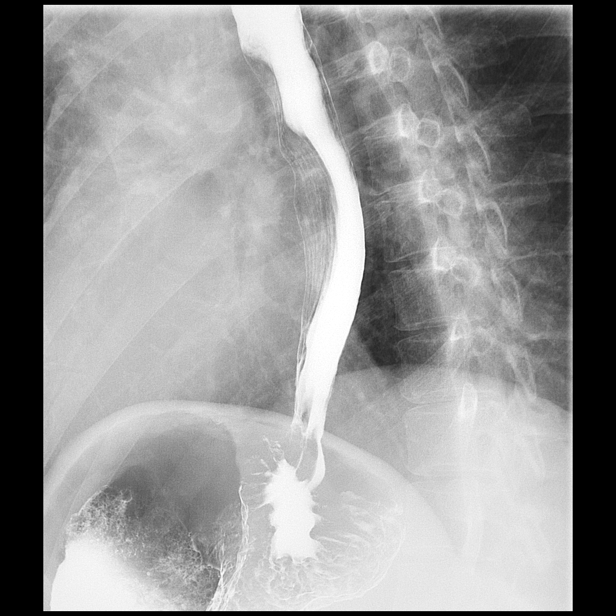

[Series 7: fluoro_barium singleshot_bw · 0.18mm/px · 1 of 1 slices shown (6 of 14)]
[im 1/1]
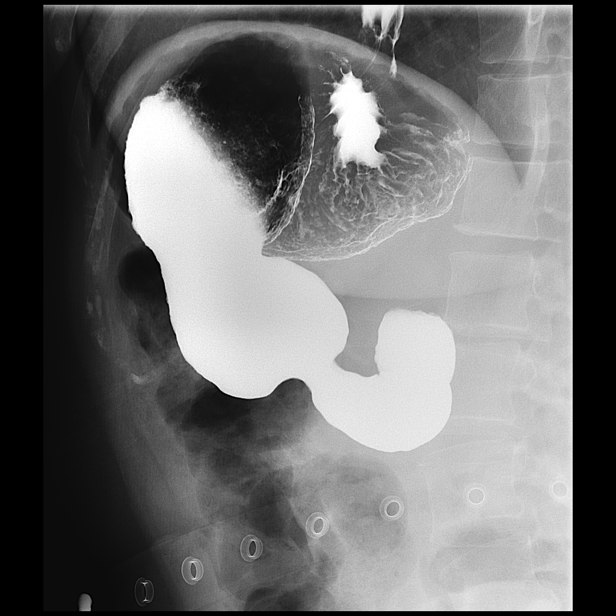

[Series 8: fluoro_barium singleshot_bw · 0.18mm/px · 1 of 1 slices shown (7 of 14)]
[im 1/1]
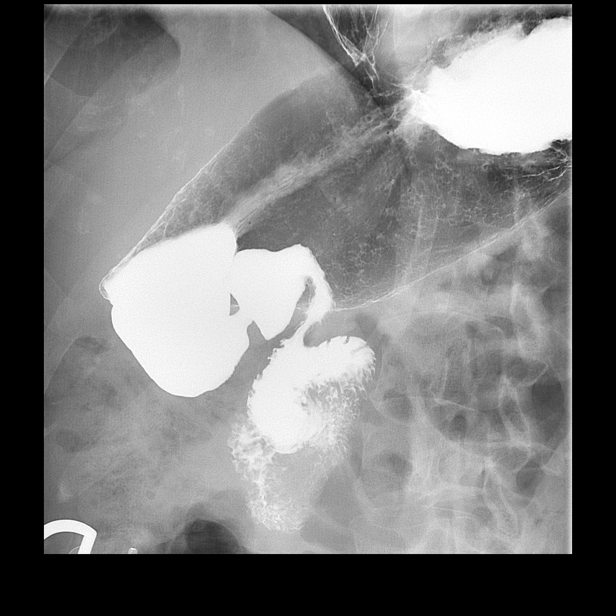

[Series 9: fluoro_barium singleshot_bw · 0.19mm/px · 1 of 1 slices shown (8 of 14)]
[im 1/1]
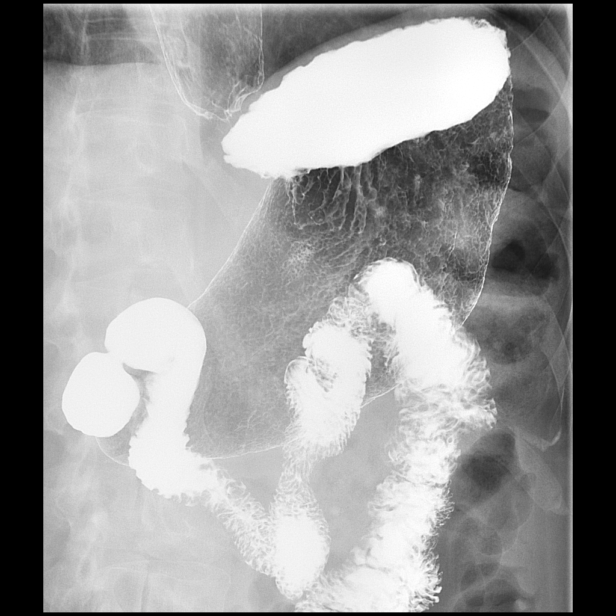

[Series 10: fluoro_barium singleshot_bw · 0.19mm/px · 1 of 1 slices shown (9 of 14)]
[im 1/1]
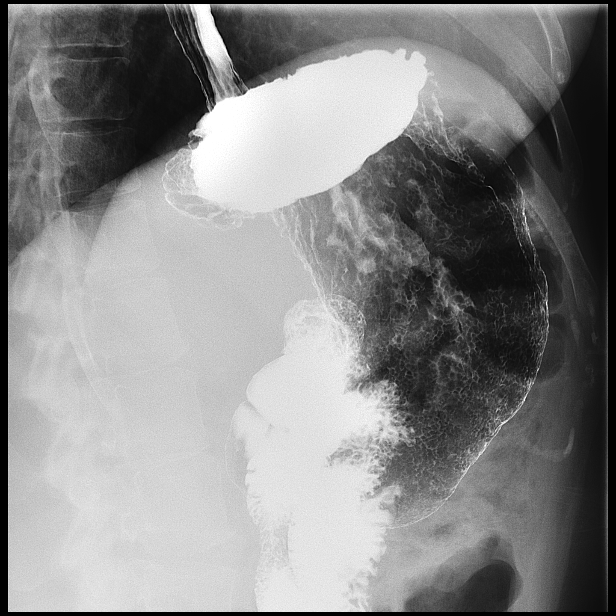

[Series 11: fluoro_barium singleshot_bw · 0.19mm/px · 1 of 1 slices shown (10 of 14)]
[im 1/1]
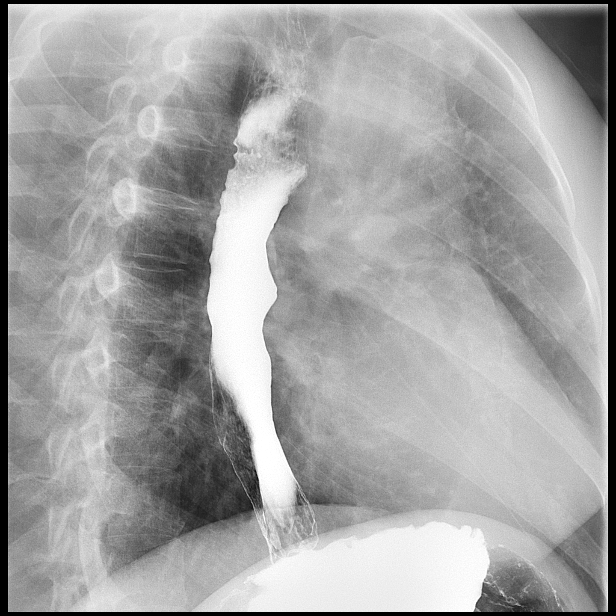

[Series 13: fluoro_barium singleshot_bw · 0.19mm/px · 1 of 1 slices shown (11 of 14)]
[im 1/1]
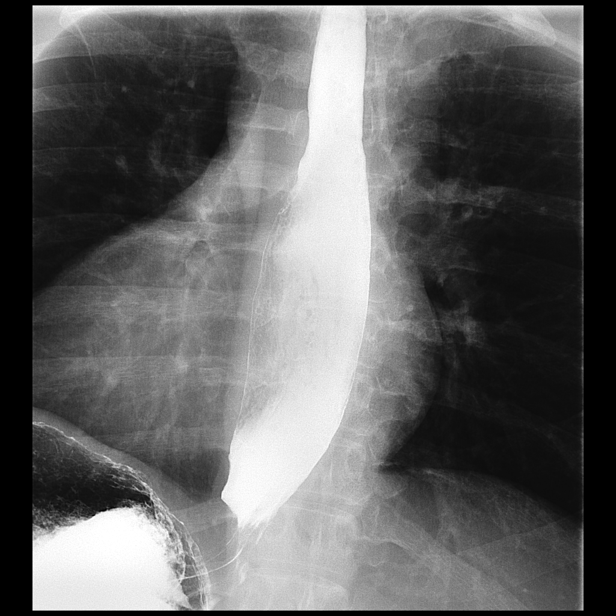

[Series 14: fluoro_barium singleshot_bw · 0.19mm/px · 1 of 1 slices shown (12 of 14)]
[im 1/1]
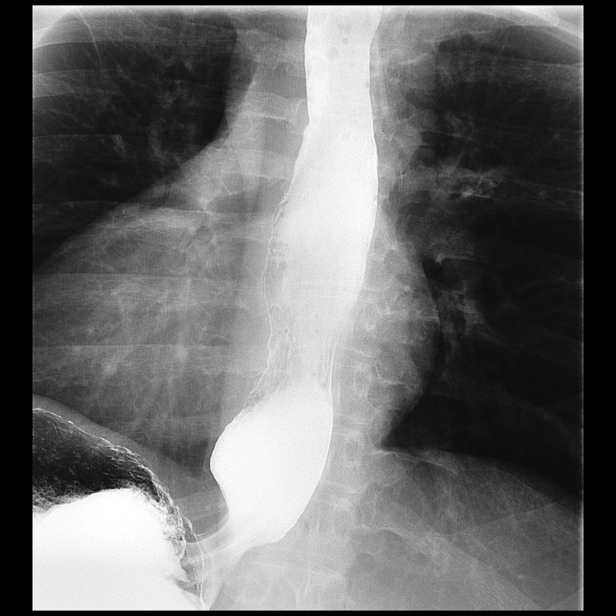

[Series 15: fluoro_barium singleshot_bw · 0.19mm/px · 1 of 1 slices shown (13 of 14)]
[im 1/1]
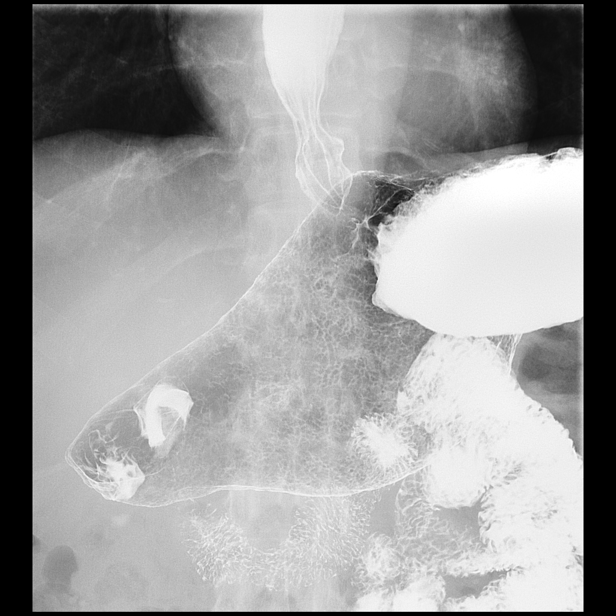

[Series 16: fluoro_barium singleshot_bw · 0.19mm/px · 1 of 1 slices shown (14 of 14)]
[im 1/1]
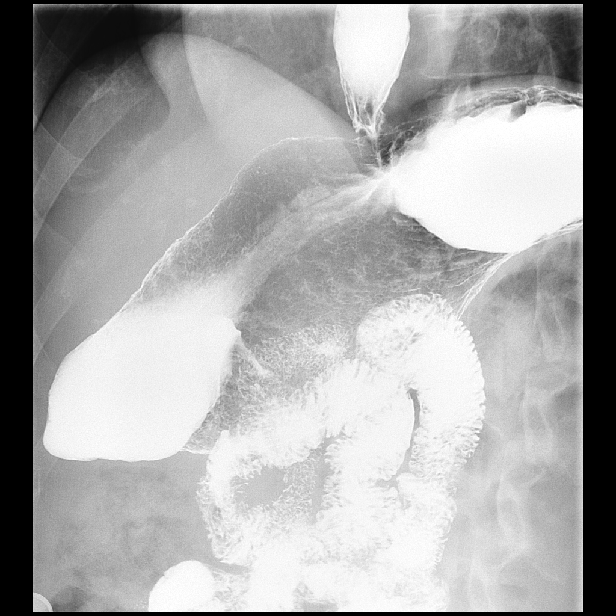

[14 of 16 positions shown; findings below may reference images not displayed]

FINDINGS: The esophagus is patulous with decreased peristalsis. Mild narrowing
of the distal esophagus. Achalasia cannot be excluded. Moderate
gastroesophageal reflux. Further evaluation by gastroenterology
suggested. The stomach and duodenum appear normal. C-loop appears
normal. Visualized proximal small bowel appears normal.
IMPRESSION: 1. The esophagus is patulous with decreased peristalsis. Mild
narrowing of the distal esophagus. Achalasia cannot be excluded.
Moderate gastroesophageal reflux. Further evaluation by
gastroenterology suggested.

2.  The stomach and duodenum appear normal.

## 2022-01-26 ENCOUNTER — Encounter: Payer: Managed Care, Other (non HMO) | Admitting: Nurse Practitioner

## 2022-02-16 ENCOUNTER — Encounter: Payer: Managed Care, Other (non HMO) | Admitting: Nurse Practitioner

## 2022-03-12 ENCOUNTER — Ambulatory Visit (INDEPENDENT_AMBULATORY_CARE_PROVIDER_SITE_OTHER): Payer: Managed Care, Other (non HMO) | Admitting: Nurse Practitioner

## 2022-03-12 ENCOUNTER — Encounter: Payer: Self-pay | Admitting: Nurse Practitioner

## 2022-03-12 VITALS — BP 138/82 | HR 75 | Temp 96.7°F | Ht 67.0 in | Wt 188.6 lb

## 2022-03-12 DIAGNOSIS — Z0001 Encounter for general adult medical examination with abnormal findings: Secondary | ICD-10-CM | POA: Diagnosis not present

## 2022-03-12 DIAGNOSIS — E559 Vitamin D deficiency, unspecified: Secondary | ICD-10-CM

## 2022-03-12 DIAGNOSIS — R300 Dysuria: Secondary | ICD-10-CM

## 2022-03-12 DIAGNOSIS — E782 Mixed hyperlipidemia: Secondary | ICD-10-CM

## 2022-03-12 DIAGNOSIS — E538 Deficiency of other specified B group vitamins: Secondary | ICD-10-CM

## 2022-03-12 DIAGNOSIS — L680 Hirsutism: Secondary | ICD-10-CM

## 2022-03-12 DIAGNOSIS — K219 Gastro-esophageal reflux disease without esophagitis: Secondary | ICD-10-CM | POA: Diagnosis not present

## 2022-03-12 MED ORDER — FAMOTIDINE 40 MG PO TABS: 40.0000 mg | ORAL_TABLET | Freq: Every day | ORAL | 2 refills | Status: DC

## 2022-03-12 NOTE — Progress Notes (Signed)
[facility] [address] [place], [place] [address]  Internal MEDICINE  Office Visit Note  Patient Name: Nina Lowe  07/08/1965  [id number]  Date of Service: 03/12/2022  Chief Complaint  Patient presents with   Annual Exam   Gastroesophageal Reflux    HPI Nina Lowe presents for an annual well visit and physical exam.  Well-appearing 56 y.o. female with GERD and nother other significant medical conditions.  Routine CRC screening: due in 2032 Routine mammogram: done in march 2023, sees OBGYN Labs: due for routine labs  New or worsening pain: none Other concerns:  Nausea and vomiting with different drinks, cannot tolerate gingerale or carbonated drinks. Has tried omeprazole and pantoprazole before.      03/12/2022    3:01 PM  Depression screen PHQ 2/9  Decreased Interest 0  Down, Depressed, Hopeless 0  PHQ - 2 Score 0     Current Medication: Outpatient Encounter Medications as of 03/12/2022  Medication Sig   famotidine (PEPCID) 40 MG tablet Take 1 tablet (40 mg total) by mouth daily.   latanoprost (XALATAN) 0.005 % ophthalmic solution 1 drop at bedtime.   PAIN RELIEVER EXTRA STRENGTH 500 MG tablet Take by mouth.   [DISCONTINUED] docusate sodium (COLACE) 100 MG capsule Take 100 mg by mouth 2 (two) times daily.   [DISCONTINUED] ergocalciferol (VITAMIN D2) 1.25 MG (50000 UT) capsule Take 1 capsule (50,000 Units total) by mouth once a week.   [DISCONTINUED] oxybutynin (DITROPAN) 5 MG tablet Take 5 mg by mouth 3 (three) times daily.   No facility-administered encounter medications on file as of 03/12/2022.    Surgical History: Past Surgical History:  Procedure Laterality Date   ABDOMINAL HYSTERECTOMY     BIOPSY  04/26/2020   Procedure: BIOPSY;  Surgeon: Wohl, Darren, MD;  Location: [facility];  Service: Endoscopy;;   COLONOSCOPY WITH PROPOFOL N/A 04/26/2020   Procedure: COLONOSCOPY WITH PROPOFOL;  Surgeon: Wohl, Darren, MD;  Location: [facility];  Service: Endoscopy;  Laterality: N/A;   Percutaneous Nephrolithotomy  12/31/2020    Medical History: Past Medical History:  Diagnosis Date   GERD (gastroesophageal reflux disease)    Heart murmur    Herpes    Kidney stones     Family History: Family History  Problem Relation Age of Onset   Hyperlipidemia Mother    Healthy Father    Breast cancer Cousin 51    Social History   Socioeconomic History   Marital status: Divorced    Spouse name: Not on file   Number of children: Not on file   Years of education: Not on file   Highest education level: Not on file  Occupational History   Not on file  Tobacco Use   Smoking status: Never   Smokeless tobacco: Never  Vaping Use   Vaping Use: Never used  Substance and Sexual Activity   Alcohol use: Yes    Comment: occassional   Drug use: Never   Sexual activity: Not on file  Other Topics Concern   Not on file  Social History Narrative   Not on file   Social Determinants of Health   Financial Resource Strain: Not on file  Food Insecurity: Not on file  Transportation Needs: Not on file  Physical Activity: Not on file  Stress: Not on file  Social Connections: Not on file  Intimate Partner Violence: Not on file      Review of Systems  Constitutional:  Negative for activity change, appetite change,   chills, fatigue, fever and unexpected weight change.  HENT: Negative.  Negative for congestion, ear pain, rhinorrhea, sore throat and trouble swallowing.   Eyes: Negative.   Respiratory: Negative.  Negative for cough, chest tightness, shortness of breath and wheezing.   Cardiovascular: Negative.  Negative for chest pain and palpitations.  Gastrointestinal:  Positive for nausea and vomiting (with gingerale and carbonated beverages). Negative for abdominal pain, blood in stool, constipation and diarrhea.  Endocrine: Negative.   Genitourinary: Negative.  Negative for difficulty urinating, dysuria, frequency,  hematuria and urgency.  Musculoskeletal: Negative.  Negative for arthralgias, back pain, joint swelling, myalgias and neck pain.  Skin: Negative.  Negative for rash and wound.  Allergic/Immunologic: Negative.  Negative for immunocompromised state.  Neurological: Negative.  Negative for dizziness, seizures, numbness and headaches.  Hematological: Negative.   Psychiatric/Behavioral: Negative.  Negative for behavioral problems, self-injury and suicidal ideas. The patient is not nervous/anxious.     Vital Signs: BP 138/82   Pulse 75   Temp (!) 96.7 F (35.9 C)   Ht 5' 7" (1.702 m)   Wt 188 lb 9.6 oz (85.5 kg)   HC 16" (40.6 cm)   SpO2 96%   BMI 29.54 kg/m    Physical Exam Vitals reviewed.  Constitutional:      General: She is awake. She is not in acute distress.    Appearance: Normal appearance. She is well-developed, well-groomed and overweight. She is not ill-appearing or diaphoretic.  HENT:     Head: Normocephalic and atraumatic.     Right Ear: Tympanic membrane, ear canal and external ear normal.     Left Ear: Tympanic membrane, ear canal and external ear normal.     Nose: Nose normal. No congestion or rhinorrhea.     Mouth/Throat:     Lips: Pink.     Mouth: Mucous membranes are moist.     Pharynx: Oropharynx is clear. Uvula midline. No oropharyngeal exudate or posterior oropharyngeal erythema.  Eyes:     General: Lids are normal. Vision grossly intact. Gaze aligned appropriately. No scleral icterus.       Right eye: No discharge.        Left eye: No discharge.     Extraocular Movements: Extraocular movements intact.     Conjunctiva/sclera: Conjunctivae normal.     Pupils: Pupils are equal, round, and reactive to light.     Funduscopic exam:    Right eye: Red reflex present.        Left eye: Red reflex present. Neck:     Thyroid: No thyromegaly.     Vascular: No JVD.     Trachea: Trachea and phonation normal. No tracheal deviation.  Cardiovascular:     Rate and  Rhythm: Normal rate and regular rhythm.     Pulses: Normal pulses.     Heart sounds: Normal heart sounds, S1 normal and S2 normal. No murmur heard.    No friction rub. No gallop.  Pulmonary:     Effort: Pulmonary effort is normal. No accessory muscle usage or respiratory distress.     Breath sounds: Normal breath sounds and air entry. No stridor. No wheezing or rales.  Chest:     Chest wall: No tenderness.     Comments: Declined clinical breast exam, gets annual mammograms Abdominal:     General: Bowel sounds are normal. There is no distension.     Palpations: Abdomen is soft. There is no shifting dullness, fluid wave, mass or pulsatile mass.  Tenderness: There is no abdominal tenderness. There is no guarding or rebound.  Musculoskeletal:        General: No tenderness or deformity. Normal range of motion.     Cervical back: Normal range of motion and neck supple.  Lymphadenopathy:     Cervical: No cervical adenopathy.  Skin:    General: Skin is warm and dry.     Capillary Refill: Capillary refill takes less than 2 seconds.     Coloration: Skin is not pale.     Findings: No erythema or rash.  Neurological:     Mental Status: She is alert and oriented to person, place, and time.     Cranial Nerves: No cranial nerve deficit.     Motor: No abnormal muscle tone.     Coordination: Coordination normal.     Gait: Gait normal.     Deep Tendon Reflexes: Reflexes are normal and symmetric.  Psychiatric:        Mood and Affect: Mood and affect normal.        Behavior: Behavior normal. Behavior is cooperative.        Thought Content: Thought content normal.        Judgment: Judgment normal.        Assessment/Plan: 1. Encounter for general adult medical examination with abnormal findings Age-appropriate preventive screenings and vaccinations discussed, annual physical exam completed. Routine labs for health maintenance ordered, see below. PHM updated.  - CBC with  Differential/Platelet - CMP14+EGFR - Lipid Profile - Vitamin D (25 hydroxy) - TSH + free T4 - Prolactin - Testosterone,Free and Total - FSH/LH - Estradiol - B12 and Folate Panel  2. GERD without esophagitis Take famotidine as prescribed. Patient will call clinic if this medication is not controlling her symptoms.  - famotidine (PEPCID) 40 MG tablet; Take 1 tablet (40 mg total) by mouth daily.  Dispense: 30 tablet; Refill: 2  3. Hirsutism Routine labs ordered - TSH + free T4 - Prolactin - Testosterone,Free and Total - FSH/LH - Estradiol  4. Vitamin D deficiency Routine lab ordered - Vitamin D (25 hydroxy)  5. Mixed hyperlipidemia Routine labs ordered - CBC with Differential/Platelet - CMP14+EGFR - Lipid Profile - Vitamin D (25 hydroxy) - TSH + free T4  6. B12 deficiency Routine labs ordered - CBC with Differential/Platelet - B12 and Folate Panel  7. Dysuria Routine urinalysis done - UA/M w/rflx Culture, Routine      General Counseling: Zondra verbalizes understanding of the findings of todays visit and agrees with plan of treatment. I have discussed any further diagnostic evaluation that may be needed or ordered today. We also reviewed her medications today. she has been encouraged to call the office with any questions or concerns that should arise related to todays visit.    Orders Placed This Encounter  Procedures   CBC with Differential/Platelet   CMP14+EGFR   Lipid Profile   Vitamin D (25 hydroxy)   TSH + free T4   Prolactin   Testosterone,Free and Total   FSH/LH   Estradiol   B12 and Folate Panel   UA/M w/rflx Culture, Routine    Meds ordered this encounter  Medications   famotidine (PEPCID) 40 MG tablet    Sig: Take 1 tablet (40 mg total) by mouth daily.    Dispense:  30 tablet    Refill:  2    Return in about 1 year (around 03/13/2023) for CPE, Alyssa PCP.   Total time spent:30 Minutes Time spent includes review   of chart, medications,  test results, and follow up plan with the patient.   [place] Controlled Substance Database was reviewed by me.  This patient was seen by Alyssa Abernathy, FNP-C in collaboration with Dr. Fozia Khan as a part of collaborative care agreement.  Alyssa R. Abernathy, MSN, FNP-C Internal medicine  

## 2022-03-15 LAB — MICROSCOPIC EXAMINATION: Casts: NONE SEEN /lpf

## 2022-03-15 LAB — UA/M W/RFLX CULTURE, ROUTINE
Bilirubin, UA: NEGATIVE
Glucose, UA: NEGATIVE
Nitrite, UA: NEGATIVE
RBC, UA: NEGATIVE
Specific Gravity, UA: 1.027 (ref 1.005–1.030)
Urobilinogen, Ur: 1 mg/dL (ref 0.2–1.0)
pH, UA: 5.5 (ref 5.0–7.5)

## 2022-03-15 LAB — URINE CULTURE, REFLEX

## 2022-06-13 LAB — AMB RESULTS CONSOLE CBG: Glucose: 117

## 2022-06-13 NOTE — Progress Notes (Signed)
Pt is not fasting 

## 2023-03-17 ENCOUNTER — Telehealth: Payer: Self-pay | Admitting: Nurse Practitioner

## 2023-03-17 ENCOUNTER — Encounter: Payer: Managed Care, Other (non HMO) | Admitting: Nurse Practitioner

## 2023-03-17 NOTE — Telephone Encounter (Signed)
 Left vm and sent mychart message to confirm 03/24/23 appointment-Toni

## 2023-03-24 ENCOUNTER — Encounter: Payer: Self-pay | Admitting: Nurse Practitioner

## 2023-03-24 ENCOUNTER — Ambulatory Visit (INDEPENDENT_AMBULATORY_CARE_PROVIDER_SITE_OTHER): Payer: Managed Care, Other (non HMO) | Admitting: Nurse Practitioner

## 2023-03-24 VITALS — BP 134/76 | HR 52 | Temp 98.3°F | Resp 16 | Ht 67.0 in | Wt 193.6 lb

## 2023-03-24 DIAGNOSIS — K219 Gastro-esophageal reflux disease without esophagitis: Secondary | ICD-10-CM | POA: Diagnosis not present

## 2023-03-24 DIAGNOSIS — L680 Hirsutism: Secondary | ICD-10-CM

## 2023-03-24 DIAGNOSIS — E538 Deficiency of other specified B group vitamins: Secondary | ICD-10-CM | POA: Diagnosis not present

## 2023-03-24 DIAGNOSIS — Z1231 Encounter for screening mammogram for malignant neoplasm of breast: Secondary | ICD-10-CM

## 2023-03-24 DIAGNOSIS — N951 Menopausal and female climacteric states: Secondary | ICD-10-CM

## 2023-03-24 DIAGNOSIS — Z0001 Encounter for general adult medical examination with abnormal findings: Secondary | ICD-10-CM

## 2023-03-24 DIAGNOSIS — E559 Vitamin D deficiency, unspecified: Secondary | ICD-10-CM

## 2023-03-24 MED ORDER — ESTRADIOL 0.5 MG PO TABS: 0.5000 mg | ORAL_TABLET | Freq: Every day | ORAL | 3 refills | Status: DC

## 2023-03-24 MED ORDER — PANTOPRAZOLE SODIUM 40 MG PO TBEC: 40.0000 mg | DELAYED_RELEASE_TABLET | Freq: Every day | ORAL | 3 refills | Status: DC

## 2023-03-24 NOTE — Progress Notes (Signed)
[facility] [address] [place], [place] [address]  Internal MEDICINE  Office Visit Note  Patient Name: Nina Lowe  [id number]  [id number]  Date of Service: 03/24/2023  Chief Complaint  Patient presents with   Gastroesophageal Reflux   Annual Exam    HPI Nina Lowe presents for an annual well visit and physical exam.  Well-appearing 58 y.o. female with acid reflux and perimenopausal symptoms.  Routine CRC screening: due in 2032 Routine mammogram: due now  Pap smear: discontinued due to hysterectomy.  Labs: had routine labs done through work  New or worsening pain: none  Other concerns: none  Issues with acid reflux -- has tried pepcid which made it worse.  Menopausal symptoms -- hot flashes, weight fluctuations, brain fog, fatigue, mood swings. Wants to try HRT.    Current Medication: Outpatient Encounter Medications as of 03/24/2023  Medication Sig   estradiol (ESTRACE) 0.5 MG tablet Take 1 tablet (0.5 mg total) by mouth daily.   pantoprazole (PROTONIX) 40 MG tablet Take 1 tablet (40 mg total) by mouth daily.   [DISCONTINUED] famotidine (PEPCID) 40 MG tablet Take 1 tablet (40 mg total) by mouth daily. (Patient not taking: Reported on 03/24/2023)   [DISCONTINUED] latanoprost (XALATAN) 0.005 % ophthalmic solution 1 drop at bedtime. (Patient not taking: Reported on 03/24/2023)   [DISCONTINUED] PAIN RELIEVER EXTRA STRENGTH 500 MG tablet Take by mouth. (Patient not taking: Reported on 03/24/2023)   No facility-administered encounter medications on file as of 03/24/2023.    Surgical History: Past Surgical History:  Procedure Laterality Date   ABDOMINAL HYSTERECTOMY     BIOPSY  04/26/2020   Procedure: BIOPSY;  Surgeon: Midge Minium, MD;  Location: [facility];  Service: Endoscopy;;   COLONOSCOPY WITH PROPOFOL N/A 04/26/2020   Procedure: COLONOSCOPY WITH PROPOFOL;  Surgeon: Midge Minium, MD;  Location: [facility];  Service: Endoscopy;   Laterality: N/A;   Percutaneous Nephrolithotomy  12/31/2020    Medical History: Past Medical History:  Diagnosis Date   GERD (gastroesophageal reflux disease)    Heart murmur    Herpes    Kidney stones     Family History: Family History  Problem Relation Age of Onset   Hyperlipidemia Mother    Healthy Father    Breast cancer Cousin 21    Social History   Socioeconomic History   Marital status: Divorced    Spouse name: Not on file   Number of children: Not on file   Years of education: Not on file   Highest education level: Not on file  Occupational History   Not on file  Tobacco Use   Smoking status: Never   Smokeless tobacco: Never  Vaping Use   Vaping status: Never Used  Substance and Sexual Activity   Alcohol use: Yes    Comment: occassional   Drug use: Never   Sexual activity: Not on file  Other Topics Concern   Not on file  Social History Narrative   Not on file   Social Drivers of Health   Financial Resource Strain: Not on file  Food Insecurity: Not on file  Transportation Needs: Not on file  Physical Activity: Not on file  Stress: Not on file  Social Connections: Not on file  Intimate Partner Violence: Not on file      Review of Systems  Constitutional:  Negative for activity change, appetite change, chills, fatigue, fever and unexpected weight change.  HENT: Negative.  Negative for congestion, ear pain, rhinorrhea, sore throat  and trouble swallowing.   Eyes: Negative.   Respiratory: Negative.  Negative for cough, chest tightness, shortness of breath and wheezing.   Cardiovascular: Negative.  Negative for chest pain and palpitations.  Gastrointestinal:  Positive for nausea (due to GERD) and vomiting (due to GERD sometimes). Negative for abdominal pain, blood in stool, constipation and diarrhea.  Endocrine: Negative.   Genitourinary: Negative.  Negative for difficulty urinating, dysuria, frequency, hematuria and urgency.  Musculoskeletal:  Negative.  Negative for arthralgias, back pain, joint swelling, myalgias and neck pain.  Skin: Negative.  Negative for rash and wound.  Allergic/Immunologic: Negative.  Negative for immunocompromised state.  Neurological: Negative.  Negative for dizziness, seizures, numbness and headaches.  Hematological: Negative.   Psychiatric/Behavioral: Negative.  Negative for behavioral problems, self-injury and suicidal ideas. The patient is not nervous/anxious.     Vital Signs: BP 134/76   Pulse (!) 52   Temp 98.3 F (36.8 C)   Resp 16   Ht 5\' 7"  (1.702 m)   Wt 193 lb 9.6 oz (87.8 kg)   SpO2 99%   BMI 30.32 kg/m    Physical Exam Vitals reviewed.  Constitutional:      General: She is awake. She is not in acute distress.    Appearance: Normal appearance. She is well-developed, well-groomed and overweight. She is not ill-appearing or diaphoretic.  HENT:     Head: Normocephalic and atraumatic.     Right Ear: Tympanic membrane, ear canal and external ear normal.     Left Ear: Tympanic membrane, ear canal and external ear normal.     Nose: Nose normal. No congestion or rhinorrhea.     Mouth/Throat:     Lips: Pink.     Mouth: Mucous membranes are moist.     Pharynx: Oropharynx is clear. Uvula midline. No oropharyngeal exudate or posterior oropharyngeal erythema.  Eyes:     General: Lids are normal. Vision grossly intact. Gaze aligned appropriately. No scleral icterus.       Right eye: No discharge.        Left eye: No discharge.     Extraocular Movements: Extraocular movements intact.     Conjunctiva/sclera: Conjunctivae normal.     Pupils: Pupils are equal, round, and reactive to light.     Funduscopic exam:    Right eye: Red reflex present.        Left eye: Red reflex present. Neck:     Thyroid: No thyromegaly.     Vascular: No JVD.     Trachea: Trachea and phonation normal. No tracheal deviation.  Cardiovascular:     Rate and Rhythm: Normal rate and regular rhythm.     Pulses:  Normal pulses.     Heart sounds: Normal heart sounds, S1 normal and S2 normal. No murmur heard.    No friction rub. No gallop.  Pulmonary:     Effort: Pulmonary effort is normal. No accessory muscle usage or respiratory distress.     Breath sounds: Normal breath sounds and air entry. No stridor. No wheezing or rales.  Chest:     Chest wall: No tenderness.     Comments: Declined clinical breast exam, gets annual mammograms Abdominal:     General: Bowel sounds are normal. There is no distension.     Palpations: Abdomen is soft. There is no shifting dullness, fluid wave, mass or pulsatile mass.     Tenderness: There is no abdominal tenderness. There is no guarding or rebound.  Musculoskeletal:  General: No tenderness or deformity. Normal range of motion.     Cervical back: Normal range of motion and neck supple.  Lymphadenopathy:     Cervical: No cervical adenopathy.  Skin:    General: Skin is warm and dry.     Capillary Refill: Capillary refill takes less than 2 seconds.     Coloration: Skin is not pale.     Findings: No erythema or rash.  Neurological:     Mental Status: She is alert and oriented to person, place, and time.     Cranial Nerves: No cranial nerve deficit.     Motor: No abnormal muscle tone.     Coordination: Coordination normal.     Gait: Gait normal.     Deep Tendon Reflexes: Reflexes are normal and symmetric.  Psychiatric:        Mood and Affect: Mood and affect normal.        Behavior: Behavior normal. Behavior is cooperative.        Thought Content: Thought content normal.        Judgment: Judgment normal.        Assessment/Plan: 1. Encounter for routine adult health examination with abnormal findings (Primary) Age-appropriate preventive screenings and vaccinations discussed, annual physical exam completed. Routine labs for health maintenance ordered, see below. PHM updated.   - CBC with Differential/Platelet - CMP14+EGFR - Vitamin D (25  hydroxy) - TSH + free T4 - Prolactin - FSH/LH - B12 and Folate Panel - Estradiol  2. Gastroesophageal reflux disease without esophagitis Start pantoprazole as prescribed, follow up in 1 month - pantoprazole (PROTONIX) 40 MG tablet; Take 1 tablet (40 mg total) by mouth daily.  Dispense: 30 tablet; Refill: 3  3. Vasomotor symptoms due to menopause Start estradiol as prescribed. Follow up in 1 months. Routine labs ordered  - estradiol (ESTRACE) 0.5 MG tablet; Take 1 tablet (0.5 mg total) by mouth daily.  Dispense: 30 tablet; Refill: 3 - CBC with Differential/Platelet - CMP14+EGFR - Vitamin D (25 hydroxy) - TSH + free T4 - Prolactin - FSH/LH - B12 and Folate Panel - Estradiol  4. Hirsutism Routine labs ordered  - CBC with Differential/Platelet - CMP14+EGFR - TSH + free T4 - Prolactin - FSH/LH - Estradiol  5. B12 deficiency Routine lab ordered  - B12 and Folate Panel  6. Vitamin D deficiency Routine lab ordered  - Vitamin D (25 hydroxy)  7. Encounter for screening mammogram for malignant neoplasm of breast Routine screening mammogram ordered  - MM 3D SCREENING MAMMOGRAM BILATERAL BREAST; Future   General Counseling: Michala verbalizes understanding of the findings of todays visit and agrees with plan of treatment. I have discussed any further diagnostic evaluation that may be needed or ordered today. We also reviewed her medications today. she has been encouraged to call the office with any questions or concerns that should arise related to todays visit.    Orders Placed This Encounter  Procedures   MM 3D SCREENING MAMMOGRAM BILATERAL BREAST   CBC with Differential/Platelet   CMP14+EGFR   Vitamin D (25 hydroxy)   TSH + free T4   Prolactin   FSH/LH   B12 and Folate Panel   Estradiol    Meds ordered this encounter  Medications   pantoprazole (PROTONIX) 40 MG tablet    Sig: Take 1 tablet (40 mg total) by mouth daily.    Dispense:  30 tablet    Refill:  3    estradiol (ESTRACE) 0.5 MG tablet  Sig: Take 1 tablet (0.5 mg total) by mouth daily.    Dispense:  30 tablet    Refill:  3    Fill new script today    Return in about 1 month (around 04/24/2023) for F/U, Labs, Janett Kamath PCP, eval new med -- estradiol and pantoprazole .   Total time spent:30 Minutes Time spent includes review of chart, medications, test results, and follow up plan with the patient.   [place] Controlled Substance Database was reviewed by me.  This patient was seen by Sallyanne Kuster, FNP-C in collaboration with Dr. Beverely Risen as a part of collaborative care agreement.  Willadene Mounsey R. Tedd Sias, MSN, FNP-C Internal medicine

## 2023-04-13 LAB — CMP14+EGFR
ALT: 17 [IU]/L (ref 0–32)
AST: 19 [IU]/L (ref 0–40)
Albumin: 4.4 g/dL (ref 3.8–4.9)
Alkaline Phosphatase: 147 [IU]/L — ABNORMAL HIGH (ref 44–121)
BUN/Creatinine Ratio: 13 (ref 9–23)
BUN: 10 mg/dL (ref 6–24)
Bilirubin Total: 0.4 mg/dL (ref 0.0–1.2)
CO2: 21 mmol/L (ref 20–29)
Calcium: 8.9 mg/dL (ref 8.7–10.2)
Chloride: 102 mmol/L (ref 96–106)
Creatinine, Ser: 0.79 mg/dL (ref 0.57–1.00)
Globulin, Total: 2.9 g/dL (ref 1.5–4.5)
Glucose: 78 mg/dL (ref 70–99)
Potassium: 4.3 mmol/L (ref 3.5–5.2)
Sodium: 138 mmol/L (ref 134–144)
Total Protein: 7.3 g/dL (ref 6.0–8.5)
eGFR: 88 mL/min/{1.73_m2} (ref >59)

## 2023-04-13 LAB — B12 AND FOLATE PANEL
Folate: 6.9 ng/mL (ref >3.0)
Vitamin B-12: 291 pg/mL (ref 232–1245)

## 2023-04-13 LAB — ESTRADIOL: Estradiol: 35 pg/mL

## 2023-04-13 LAB — CBC WITH DIFFERENTIAL/PLATELET
Basophils Absolute: 0 10*3/uL (ref 0.0–0.2)
Basos: 1 %
EOS (ABSOLUTE): 0.1 10*3/uL (ref 0.0–0.4)
Eos: 2 %
Hematocrit: 38.6 % (ref 34.0–46.6)
Hemoglobin: 12.5 g/dL (ref 11.1–15.9)
Immature Grans (Abs): 0 10*3/uL (ref 0.0–0.1)
Immature Granulocytes: 0 %
Lymphocytes Absolute: 1.7 10*3/uL (ref 0.7–3.1)
Lymphs: 45 %
MCH: 29.1 pg (ref 26.6–33.0)
MCHC: 32.4 g/dL (ref 31.5–35.7)
MCV: 90 fL (ref 79–97)
Monocytes Absolute: 0.3 10*3/uL (ref 0.1–0.9)
Monocytes: 7 %
Neutrophils Absolute: 1.6 10*3/uL (ref 1.4–7.0)
Neutrophils: 45 %
Platelets: 242 10*3/uL (ref 150–450)
RBC: 4.29 x10E6/uL (ref 3.77–5.28)
RDW: 12.5 % (ref 11.7–15.4)
WBC: 3.7 10*3/uL (ref 3.4–10.8)

## 2023-04-13 LAB — PROLACTIN: Prolactin: 10.9 ng/mL (ref 3.6–25.2)

## 2023-04-13 LAB — TSH+FREE T4
Free T4: 1.12 ng/dL (ref 0.82–1.77)
TSH: 2.38 u[IU]/mL (ref 0.450–4.500)

## 2023-04-13 LAB — FSH/LH
FSH: 69.8 m[IU]/mL
LH: 59.3 m[IU]/mL

## 2023-04-13 LAB — VITAMIN D 25 HYDROXY (VIT D DEFICIENCY, FRACTURES): Vit D, 25-Hydroxy: 16.1 ng/mL — ABNORMAL LOW (ref 30.0–100.0)

## 2023-04-15 ENCOUNTER — Other Ambulatory Visit: Payer: Self-pay | Admitting: Nurse Practitioner

## 2023-04-15 DIAGNOSIS — N951 Menopausal and female climacteric states: Secondary | ICD-10-CM

## 2023-04-22 ENCOUNTER — Ambulatory Visit: Payer: Managed Care, Other (non HMO) | Admitting: Nurse Practitioner

## 2023-06-20 ENCOUNTER — Other Ambulatory Visit: Payer: Self-pay | Admitting: Nurse Practitioner

## 2023-06-20 DIAGNOSIS — K219 Gastro-esophageal reflux disease without esophagitis: Secondary | ICD-10-CM

## 2023-07-14 ENCOUNTER — Encounter: Payer: Self-pay | Admitting: Nurse Practitioner

## 2023-07-14 NOTE — Progress Notes (Signed)
 Missed you in February for your follow up appointment so I wanted to just check in about your labs.   Most of the labs were normal including the hormone levels.  Vitamin D  is low and I do recommend a high strength supplement -- usually I prescribe a weekly supplement for 6-12 months and then recheck this level.  Your vitamin B12 level is also low and I do recommend an over the counter supplement of 1000 mcg daily.

## 2023-07-21 ENCOUNTER — Telehealth: Payer: Self-pay

## 2023-07-21 NOTE — Telephone Encounter (Signed)
 Pt advised for labs result and gave toni for follow up appt

## 2023-08-20 ENCOUNTER — Ambulatory Visit: Admitting: Nurse Practitioner

## 2024-01-07 ENCOUNTER — Ambulatory Visit
Admission: EM | Admit: 2024-01-07 | Discharge: 2024-01-07 | Disposition: A | Discharge status: Home / Self Care | Attending: Emergency Medicine | Admitting: Emergency Medicine

## 2024-01-07 ENCOUNTER — Encounter: Payer: Self-pay | Admitting: Emergency Medicine

## 2024-01-07 DIAGNOSIS — R300 Dysuria: Secondary | ICD-10-CM | POA: Diagnosis present

## 2024-01-07 LAB — URINALYSIS, W/ REFLEX TO CULTURE (INFECTION SUSPECTED)
Bilirubin Urine: NEGATIVE
Glucose, UA: NEGATIVE mg/dL
Nitrite: NEGATIVE
Protein, ur: NEGATIVE mg/dL
Specific Gravity, Urine: 1.025 (ref 1.005–1.030)
pH: 5.5 (ref 5.0–8.0)

## 2024-01-07 MED ORDER — PHENAZOPYRIDINE HCL 200 MG PO TABS: 200.0000 mg | ORAL_TABLET | Freq: Three times a day (TID) | ORAL | 0 refills | Status: AC

## 2024-01-07 NOTE — ED Provider Notes (Signed)
 [facility]    CSN: [id number] Arrival date & time: 01/07/24  1240      History   Chief Complaint Chief Complaint  Patient presents with   Dysuria   Urinary Frequency    HPI Nina Lowe is a 58 y.o. female.   HPI  Female with past medical history significant for heart murmur, GERD, and kidney stones presents for evaluation of dysuria, urgency, and frequency that started earlier this week.  She has had some mild low back pain but denies any fever, hematuria, or abdominal pain.  Past Medical History:  Diagnosis Date   GERD (gastroesophageal reflux disease)    Heart murmur    Herpes    Kidney stones     Patient Active Problem List   Diagnosis Date Noted   Special screening for malignant neoplasms, colon    Polyp of transverse colon    Acute upper respiratory infection 08/28/2019   Cough 08/28/2019   Routine cervical smear 12/07/2018   Urinary tract infection without hematuria 12/07/2018   Other fatigue 12/07/2018   Screening for breast cancer 12/06/2017   Gastroesophageal reflux disease without esophagitis 12/06/2017   BMI 29.0-29.9,adult 12/06/2017   Needs flu shot 12/06/2017   Dysuria 12/06/2017    Past Surgical History:  Procedure Laterality Date   ABDOMINAL HYSTERECTOMY     BIOPSY  04/26/2020   Procedure: BIOPSY;  Surgeon: Jinny Carmine, MD;  Location: [facility];  Service: Endoscopy;;   COLONOSCOPY WITH PROPOFOL  N/A 04/26/2020   Procedure: COLONOSCOPY WITH PROPOFOL ;  Surgeon: Jinny Carmine, MD;  Location: [facility];  Service: Endoscopy;  Laterality: N/A;   Percutaneous Nephrolithotomy  12/31/2020    OB History   No obstetric history on file.      Home Medications    Prior to Admission medications   Medication Sig Start Date End Date Taking? Authorizing Provider  phenazopyridine (PYRIDIUM) 200 MG tablet Take 1 tablet (200 mg total) by mouth 3 (three) times daily. 01/07/24  Yes Bernardino Ditch, NP    Family  History Family History  Problem Relation Age of Onset   Hyperlipidemia Mother    Healthy Father    Breast cancer Cousin 71    Social History Social History   Tobacco Use   Smoking status: Never   Smokeless tobacco: Never  Vaping Use   Vaping status: Never Used  Substance Use Topics   Alcohol use: Yes    Comment: occassional   Drug use: Never     Allergies   Patient has no known allergies.   Review of Systems Review of Systems  Constitutional:  Negative for fever.  Gastrointestinal:  Negative for abdominal pain.  Genitourinary:  Positive for dysuria, frequency and urgency. Negative for hematuria, vaginal discharge and vaginal pain.  Musculoskeletal:  Positive for back pain.     Physical Exam Triage Vital Signs ED Triage Vitals  Encounter Vitals Group     BP      Girls Systolic BP Percentile      Girls Diastolic BP Percentile      Boys Systolic BP Percentile      Boys Diastolic BP Percentile      Pulse      Resp      Temp      Temp src      SpO2      Weight      Height      Head Circumference      Peak Flow  Pain Score      Pain Loc      Pain Education      Exclude from Growth Chart    No data found.  Updated Vital Signs BP (!) 140/85 (BP Location: Left Arm)   Pulse 72   Temp 98.4 F (36.9 C) (Oral)   Resp 14   Ht 5' 7 (1.702 m)   Wt 193 lb 5.5 oz (87.7 kg)   SpO2 100%   BMI 30.28 kg/m   Visual Acuity Right Eye Distance:   Left Eye Distance:   Bilateral Distance:    Right Eye Near:   Left Eye Near:    Bilateral Near:     Physical Exam Vitals and nursing note reviewed.  Constitutional:      Appearance: Normal appearance. She is not ill-appearing.  HENT:     Head: Normocephalic and atraumatic.  Cardiovascular:     Rate and Rhythm: Normal rate.     Pulses: Normal pulses.     Heart sounds: Normal heart sounds. No murmur heard.    No friction rub. No gallop.  Pulmonary:     Effort: Pulmonary effort is normal.     Breath  sounds: Normal breath sounds. No wheezing, rhonchi or rales.  Abdominal:     Tenderness: There is no right CVA tenderness or left CVA tenderness.  Skin:    General: Skin is warm and dry.     Capillary Refill: Capillary refill takes less than 2 seconds.  Neurological:     General: No focal deficit present.     Mental Status: She is alert and oriented to person, place, and time.      UC Treatments / Results  Labs (all labs ordered are listed, but only abnormal results are displayed) Labs Reviewed  URINALYSIS, W/ REFLEX TO CULTURE (INFECTION SUSPECTED) - Abnormal; Notable for the following components:      Result Value   Hgb urine dipstick SMALL (*)    Ketones, ur TRACE (*)    Leukocytes,Ua TRACE (*)    Bacteria, UA FEW (*)    All other components within normal limits  CERVICOVAGINAL ANCILLARY ONLY    EKG   Radiology No results found.  Procedures Procedures (including critical care time)  Medications Ordered in UC Medications - No data to display  Initial Impression / Assessment and Plan / UC Course  I have reviewed the triage vital signs and the nursing notes.  Pertinent labs & imaging results that were available during my care of the patient were reviewed by me and considered in my medical decision making (see chart for details).   Patient is a pleasant, nontoxic-appearing 74 old female presenting for evaluation of several days worth of UTI symptoms as outlined HPI above.  She is denying any vaginal symptoms.  She has no history of UTIs.  No CVA tenderness on exam.  I will order urinalysis to assess for presence of UTI.  Urinalysis shows trace leukocyte esterase and small hemoglobin with trace ketones but negative for nitrites, protein, or glucose.  Reflex microscopy shows 0-5 squamous epithelials, 0-5 WBCs, 6-10 RBCs, and few bacteria.  I will order a cytology swab to assess for BV or yeast.  I discussed with the patient that even though she is not experiencing vaginal  discharge or itching these could be causing her symptoms.  It is also possible that it could be genitourinary syndrome of menopause that could be contributing to her symptoms.  If the cytology swab is negative  I have encouraged her to follow-up with her GYN or with her PCP for a more in-depth examination.   Final Clinical Impressions(s) / UC Diagnoses   Final diagnoses:  Dysuria     Discharge Instructions      As we discussed, your urinalysis did not show any signs of infection.  We are sending the cytology swab off to see if there is another source of infection that could be causing your symptoms.  Use the Pyridium every 8 hours as needed for painful urination or urinary urgency or frequency.  Able to urinate very vivid red-orange.  The swab we collected will be back in the next several days.  If you test positive for any infection you will be contacted by phone and treatment options will be provided.  If your results are negative I encourage you to follow-up with your primary care provider or your GYN for a more in-depth examination.     ED Prescriptions     Medication Sig Dispense Auth. Provider   phenazopyridine (PYRIDIUM) 200 MG tablet Take 1 tablet (200 mg total) by mouth 3 (three) times daily. 6 tablet Bernardino Ditch, NP      PDMP not reviewed this encounter.   Bernardino Ditch, NP 01/07/24 1319

## 2024-01-07 NOTE — Discharge Instructions (Addendum)
 As we discussed, your urinalysis did not show any signs of infection.  We are sending the cytology swab off to see if there is another source of infection that could be causing your symptoms.  Use the Pyridium every 8 hours as needed for painful urination or urinary urgency or frequency.  Able to urinate very vivid red-orange.  The swab we collected will be back in the next several days.  If you test positive for any infection you will be contacted by phone and treatment options will be provided.  If your results are negative I encourage you to follow-up with your primary care provider or your GYN for a more in-depth examination.

## 2024-01-07 NOTE — ED Triage Notes (Signed)
 Patient c/o dysuria and urinary frequency that started earlier this week.  Patient denies fevers.

## 2024-01-10 LAB — CERVICOVAGINAL ANCILLARY ONLY
Bacterial Vaginitis (gardnerella): NEGATIVE
Candida Glabrata: NEGATIVE
Candida Vaginitis: NEGATIVE
Comment: NEGATIVE
Comment: NEGATIVE
Comment: NEGATIVE

## 2024-02-02 ENCOUNTER — Ambulatory Visit
Admission: RE | Admit: 2024-02-02 | Discharge: 2024-02-02 | Disposition: A | Discharge status: Home / Self Care | Source: Ambulatory Visit | Attending: Emergency Medicine | Admitting: Emergency Medicine

## 2024-02-02 VITALS — BP 131/85 | HR 58 | Temp 98.7°F | Resp 17 | Wt 190.0 lb

## 2024-02-02 DIAGNOSIS — J069 Acute upper respiratory infection, unspecified: Secondary | ICD-10-CM | POA: Diagnosis not present

## 2024-02-02 LAB — POC COVID19/FLU A&B COMBO
Covid Antigen, POC: NEGATIVE
Influenza A Antigen, POC: NEGATIVE
Influenza B Antigen, POC: NEGATIVE

## 2024-02-02 MED ORDER — PROMETHAZINE-DM 6.25-15 MG/5ML PO SYRP: 5.0000 mL | ORAL_SOLUTION | Freq: Four times a day (QID) | ORAL | 0 refills | Status: AC | PRN

## 2024-02-02 NOTE — ED Provider Notes (Signed)
 [facility]    CSN: [id number] Arrival date & time: 02/02/24  1139      History   Chief Complaint Chief Complaint  Patient presents with   Cough    Entered by patient    HPI Nina Lowe is a 58 y.o. female.   58 year old female, Nina Lowe, presents to urgent care for evaluation of cough runny nose chills and bodyaches x 4 days.  Patient has all her parents that are staying with her and will be tested for COVID and flu.   The history is provided by the patient. No language interpreter was used.    Past Medical History:  Diagnosis Date   GERD (gastroesophageal reflux disease)    Heart murmur    Herpes    Kidney stones     Patient Active Problem List   Diagnosis Date Noted   Special screening for malignant neoplasms, colon    Polyp of transverse colon    Viral URI with cough 08/28/2019   Cough 08/28/2019   Routine cervical smear 12/07/2018   Urinary tract infection without hematuria 12/07/2018   Other fatigue 12/07/2018   Screening for breast cancer 12/06/2017   Gastroesophageal reflux disease without esophagitis 12/06/2017   BMI 29.0-29.9,adult 12/06/2017   Needs flu shot 12/06/2017   Dysuria 12/06/2017    Past Surgical History:  Procedure Laterality Date   ABDOMINAL HYSTERECTOMY     BIOPSY  04/26/2020   Procedure: BIOPSY;  Surgeon: Jinny Carmine, MD;  Location: [facility];  Service: Endoscopy;;   COLONOSCOPY WITH PROPOFOL  N/A 04/26/2020   Procedure: COLONOSCOPY WITH PROPOFOL ;  Surgeon: Jinny Carmine, MD;  Location: [facility];  Service: Endoscopy;  Laterality: N/A;   Percutaneous Nephrolithotomy  12/31/2020    OB History   No obstetric history on file.      Home Medications    Prior to Admission medications   Medication Sig Start Date End Date Taking? Authorizing Provider  promethazine -dextromethorphan (PROMETHAZINE -DM) 6.25-15 MG/5ML syrup Take 5 mLs by mouth 4 (four) times daily as needed for cough.  02/02/24  Yes Yen Wandell, Rilla, NP  phenazopyridine  (PYRIDIUM ) 200 MG tablet Take 1 tablet (200 mg total) by mouth 3 (three) times daily. 01/07/24   Bernardino Ditch, NP    Family History Family History  Problem Relation Age of Onset   Hyperlipidemia Mother    Healthy Father    Breast cancer Cousin 57    Social History Social History   Tobacco Use   Smoking status: Never   Smokeless tobacco: Never  Vaping Use   Vaping status: Never Used  Substance Use Topics   Alcohol use: Yes    Comment: occassional   Drug use: Never     Allergies   Patient has no known allergies.   Review of Systems Review of Systems  Constitutional:  Positive for chills.  HENT:  Positive for congestion and rhinorrhea.   Respiratory:  Positive for cough.   Musculoskeletal:  Positive for myalgias.  All other systems reviewed and are negative.    Physical Exam Triage Vital Signs ED Triage Vitals  Encounter Vitals Group     BP 02/02/24 1150 131/85     Girls Systolic BP Percentile --      Girls Diastolic BP Percentile --      Boys Systolic BP Percentile --      Boys Diastolic BP Percentile --      Pulse Rate 02/02/24 1150 (!) 58     Resp 02/02/24  1150 17     Temp 02/02/24 1150 98.7 F (37.1 C)     Temp Source 02/02/24 1150 Oral     SpO2 02/02/24 1150 95 %     Weight 02/02/24 1149 190 lb (86.2 kg)     Height --      Head Circumference --      Peak Flow --      Pain Score 02/02/24 1149 0     Pain Loc --      Pain Education --      Exclude from Growth Chart --    No data found.  Updated Vital Signs BP 131/85 (BP Location: Right Arm)   Pulse (!) 58   Temp 98.7 F (37.1 C) (Oral)   Resp 17   Wt 190 lb (86.2 kg)   SpO2 95%   BMI 29.76 kg/m   Visual Acuity Right Eye Distance:   Left Eye Distance:   Bilateral Distance:    Right Eye Near:   Left Eye Near:    Bilateral Near:     Physical Exam Vitals and nursing note reviewed.  Constitutional:      General: She is not in  acute distress.    Appearance: She is well-developed and well-groomed.  HENT:     Head: Normocephalic.     Right Ear: Tympanic membrane is retracted.     Left Ear: Tympanic membrane is retracted.     Nose: Congestion present.     Mouth/Throat:     Lips: Pink.     Mouth: Mucous membranes are moist.     Pharynx: Oropharynx is clear.  Eyes:     General: Lids are normal.     Conjunctiva/sclera: Conjunctivae normal.     Pupils: Pupils are equal, round, and reactive to light.  Neck:     Trachea: No tracheal deviation.  Cardiovascular:     Rate and Rhythm: Normal rate and regular rhythm.     Heart sounds: Normal heart sounds. No murmur heard. Pulmonary:     Effort: Pulmonary effort is normal.     Breath sounds: Normal breath sounds and air entry.  Abdominal:     General: Bowel sounds are normal.     Palpations: Abdomen is soft.     Tenderness: There is no abdominal tenderness.  Musculoskeletal:        General: Normal range of motion.     Cervical back: Normal range of motion.  Lymphadenopathy:     Cervical: No cervical adenopathy.  Skin:    General: Skin is warm and dry.     Findings: No rash.  Neurological:     General: No focal deficit present.     Mental Status: She is alert and oriented to person, place, and time.     GCS: GCS eye subscore is 4. GCS verbal subscore is 5. GCS motor subscore is 6.  Psychiatric:        Speech: Speech normal.        Behavior: Behavior normal. Behavior is cooperative.      UC Treatments / Results  Labs (all labs ordered are listed, but only abnormal results are displayed) Labs Reviewed  POC COVID19/FLU A&B COMBO - Normal    EKG   Radiology No results found.  Procedures Procedures (including critical care time)  Medications Ordered in UC Medications - No data to display  Initial Impression / Assessment and Plan / UC Course  I have reviewed the triage vital signs and the nursing notes.  Pertinent labs & imaging results that  were available during my care of the patient were reviewed by me and considered in my medical decision making (see chart for details).  Clinical Course as of 02/02/24 1225  Wed Feb 02, 2024  1215 Covid and flu are negative [JD]    Clinical Course User Index [JD] Maikel Neisler, Rilla, NP   Discussed exam findings and plan of care with patient, COVID and flu are both negative , scripted Promethazine  DM for cough , strict go to ER precautions given.   Patient verbalized understanding to this provider.  Ddx: Viral URI with cough, allergies Final Clinical Impressions(s) / UC Diagnoses   Final diagnoses:  Viral URI with cough     Discharge Instructions      Most likely you have a viral illness: no antibiotic is indicated at this time, May treat with OTC meds of choice.  Take Promethazine  DM as prescribed , drowsiness precautions , make sure to drink plenty of fluids to stay hydrated(gatorade, water , popsicles,jello,etc), avoid caffeine products. Follow up with PCP.    ED Prescriptions     Medication Sig Dispense Auth. Provider   promethazine -dextromethorphan (PROMETHAZINE -DM) 6.25-15 MG/5ML syrup Take 5 mLs by mouth 4 (four) times daily as needed for cough. 118 mL Debborah Alonge, NP      PDMP not reviewed this encounter.   Aminta Rilla, NP 02/02/24 1225

## 2024-02-02 NOTE — Discharge Instructions (Addendum)
 Most likely you have a viral illness: no antibiotic is indicated at this time, May treat with OTC meds of choice.  Take Promethazine  DM as prescribed , drowsiness precautions , make sure to drink plenty of fluids to stay hydrated(gatorade, water , popsicles,jello,etc), avoid caffeine products. Follow up with PCP.

## 2024-02-02 NOTE — ED Triage Notes (Signed)
 Patient states that she's had a cough since sat. Runny nose, chills and bodyaches. Patient has older parents with her and wants to be tested for covid flu
